# Patient Record
Sex: Female | Born: 1985 | Race: White | Hispanic: No | Marital: Married | Smoking: Never smoker
Health system: Southern US, Community
[De-identification: ages and names within clinical notes are randomized; demographics above are authoritative.]

## PROBLEM LIST (undated history)

## (undated) DIAGNOSIS — Z87448 Personal history of other diseases of urinary system: Secondary | ICD-10-CM

## (undated) DIAGNOSIS — R112 Nausea with vomiting, unspecified: Secondary | ICD-10-CM

## (undated) DIAGNOSIS — I10 Essential (primary) hypertension: Secondary | ICD-10-CM

## (undated) DIAGNOSIS — Z8759 Personal history of other complications of pregnancy, childbirth and the puerperium: Secondary | ICD-10-CM

## (undated) DIAGNOSIS — Z9889 Other specified postprocedural states: Secondary | ICD-10-CM

## (undated) DIAGNOSIS — Z8679 Personal history of other diseases of the circulatory system: Secondary | ICD-10-CM

## (undated) DIAGNOSIS — T8859XA Other complications of anesthesia, initial encounter: Secondary | ICD-10-CM

## (undated) DIAGNOSIS — R42 Dizziness and giddiness: Secondary | ICD-10-CM

## (undated) HISTORY — DX: Essential (primary) hypertension: I10

## (undated) HISTORY — DX: Other specified postprocedural states: Z98.890

## (undated) HISTORY — DX: Personal history of other diseases of urinary system: Z87.448

## (undated) HISTORY — DX: Dizziness and giddiness: R42

## (undated) HISTORY — DX: Personal history of other diseases of the circulatory system: Z86.79

## (undated) HISTORY — DX: Nausea with vomiting, unspecified: R11.2

## (undated) HISTORY — PX: NO PAST SURGERIES: SHX2092

## (undated) HISTORY — DX: Personal history of other complications of pregnancy, childbirth and the puerperium: Z87.59

## (undated) HISTORY — DX: Other complications of anesthesia, initial encounter: T88.59XA

## (undated) HISTORY — PX: DENTAL SURGERY: SHX609

---

## 2012-12-31 ENCOUNTER — Other Ambulatory Visit: Payer: Self-pay | Admitting: *Deleted

## 2012-12-31 ENCOUNTER — Encounter: Payer: Self-pay | Admitting: *Deleted

## 2013-01-01 ENCOUNTER — Telehealth: Payer: Self-pay | Admitting: *Deleted

## 2013-01-01 ENCOUNTER — Ambulatory Visit (INDEPENDENT_AMBULATORY_CARE_PROVIDER_SITE_OTHER): Payer: BC Managed Care – PPO | Admitting: Internal Medicine

## 2013-01-01 ENCOUNTER — Encounter: Payer: Self-pay | Admitting: Internal Medicine

## 2013-01-01 VITALS — BP 131/77 | HR 72 | Ht 64.0 in | Wt 131.1 lb

## 2013-01-01 DIAGNOSIS — I10 Essential (primary) hypertension: Secondary | ICD-10-CM | POA: Insufficient documentation

## 2013-01-01 DIAGNOSIS — R42 Dizziness and giddiness: Secondary | ICD-10-CM | POA: Insufficient documentation

## 2013-01-01 NOTE — Telephone Encounter (Signed)
Lmtcb.  Pt aware Dr. Graciela Husbands has consulted with nephrologists (2) & pt does not need nephrology evaluation at this time. It was recommended ( per Dr. Graciela Husbands) that pt have an ultrasound of her kidneys. Also have a urinalysis yearly with pcp. States he has sent  Message to Dr. Katrinka Blazing regarding this. Mylo Red RN

## 2013-01-01 NOTE — Assessment & Plan Note (Signed)
As above.

## 2013-01-01 NOTE — Progress Notes (Signed)
Foggy feeling.

## 2013-01-01 NOTE — Progress Notes (Signed)
ELECTROPHYSIOLOGY CONSULT NOTE  Patient ID: Melanie Love, MRN: [id number], DOB/AGE: 09/15/1985 27 y.o. Admit date: (Not on file) Date of Consult: 01/01/2013  Primary Physician: Allean Found, MD Primary Cardiologist: new   Chief Complaint: dizziness   HPI Melanie Love is a 27 y.o. 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 Her middle school life was complicated by the development of hematuria and proteinuria after which she was noted to be hypertensive. She's been on antihypertensive therapy with labetalol for a couple of years with good control.  She couple of episodes of nausea and vomiting this spring which were associated and followed by significant dizziness which was orthostatic in nature. She has had some orthostasis with similar epiphenomena associated with rapid standing. She's had no significant heat intolerance, menses aggravation, shower intolerance. She's had no prior syncope.  Her diet is replete of salt and water.  She is recently married.    Past Medical History  Diagnosis Date  . Hypertension   . 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 Dizziness       Surgical History: No past surgical history on file.   Home Meds: Prior to Admission medications   Medication Sig Start Date End Date Taking? Authorizing Provider  Cranberry 250 MG CAPS Take 2 capsules by mouth.    Yes Historical Provider, MD  labetalol (NORMODYNE) 100 MG tablet Take 100 mg by mouth 2 (two) times daily.   Yes Historical Provider, MD  Lactobacillus-Inulin (CULTURELLE DIGESTIVE HEALTH) CAPS Take 1 capsule by mouth.   Yes Historical Provider, MD  levonorgestrel-ethinyl estradiol (AVIANE,ALESSE,LESSINA) 0.1-20 MG-MCG tablet Take 1 tablet by mouth daily.   Yes Historical Provider, MD    Allergies: Not on File  History   Social History  . Marital Status:  Married    Spouse Name: N/A    Number of Children: N/A  . Years of Education: N/A   Occupational History  . Not on file.   Social History Main Topics  . Smoking status: Never Smoker   . Smokeless tobacco: Not on file  . Alcohol Use: Not on file  . Drug Use: Not on file  . Sexually Active: Not on file   Other Topics Concern  . Not on file   Social History Narrative  . No narrative on file     No family history on file.   ROS:  Please see the history of present illness.     All other systems reviewed and negative.    Physical Exam:  BP 126/76  Pulse 83  Ht 5\' 4"  (1.626 m)  Wt 131 lb 1.9 oz (59.476 kg)  BMI 22.5 kg/m2  Alert and oriented in no acute distress HENT- normal Eyes- EOMI, without scleral icterus Skin- warm and dry; without rashes LN-neg Neck- supple without thyromegaly, JVP-flat, carotids brisk and full without bruits Back-without CVAT or kyphosis Lungs-clear to auscultation CV-Regular rate and rhythm, nl S1 and S2, no murmurs gallops or rubs, S4-absent Abd-soft with active bowel sounds; no midline pulsation or hepatomegaly Pulses-intact femoral and distal MKS-without gross deformity Neuro- Ax O, CN3-12 intact, grossly normal motor and sensory function Affect engaging       EKG:   Sinus rhythm at 83 Intervals 05/03/33 Borderline right axis Short PR without evidence of ventricular preexcitation   Assessment and Plan:     Sherryl Manges

## 2013-01-01 NOTE — Assessment & Plan Note (Addendum)
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  We discussed extensively the physiology and the value of theprodrome to give warning to impending symptoms and/or risk of syncope  I dont think her BP issues are related.  I did touch base with nephrology who suggested renal US was valuable, but that further eval based on hematuria 2 years ago is probably not  We revieed the physioogy and the potential interplay for the bulk of an hour

## 2013-10-14 ENCOUNTER — Other Ambulatory Visit (HOSPITAL_COMMUNITY)
Admission: RE | Admit: 2013-10-14 | Discharge: 2013-10-14 | Disposition: A | Discharge status: Home / Self Care | Payer: BC Managed Care – PPO | Source: Ambulatory Visit | Attending: Family Medicine | Admitting: Family Medicine

## 2013-10-14 ENCOUNTER — Other Ambulatory Visit: Payer: Self-pay | Admitting: Family Medicine

## 2013-10-14 DIAGNOSIS — Z01419 Encounter for gynecological examination (general) (routine) without abnormal findings: Secondary | ICD-10-CM | POA: Insufficient documentation

## 2015-03-10 LAB — OB RESULTS CONSOLE GBS: GBS: POSITIVE

## 2015-03-10 LAB — OB RESULTS CONSOLE GC/CHLAMYDIA
Chlamydia: NEGATIVE
Gonorrhea: NEGATIVE

## 2015-03-10 LAB — OB RESULTS CONSOLE RPR: RPR: NONREACTIVE

## 2015-03-10 LAB — OB RESULTS CONSOLE ABO/RH: RH Type: POSITIVE

## 2015-03-10 LAB — OB RESULTS CONSOLE ANTIBODY SCREEN: Antibody Screen: NEGATIVE

## 2015-03-10 LAB — OB RESULTS CONSOLE RUBELLA ANTIBODY, IGM: RUBELLA: IMMUNE

## 2015-03-10 LAB — OB RESULTS CONSOLE HIV ANTIBODY (ROUTINE TESTING): HIV: NONREACTIVE

## 2015-03-10 LAB — OB RESULTS CONSOLE HEPATITIS B SURFACE ANTIGEN: Hepatitis B Surface Ag: NEGATIVE

## 2015-06-27 NOTE — L&D Delivery Note (Signed)
Patient was C/C/+1 and pushed for 2h 40 minutes with epidural.   NSVD female infant, Apgars 8/9, weight pending.   The patient had a right vaginal wall tear and 2nd degree perineal tear both repaired with vicryl. Fundus was firm. EBL was expected amount. Placenta was delivered intact. Vagina was clear.  Baby was vigorous and doing skin to skin with mother.  Philip AspenALLAHAN, Jessicah Croll

## 2015-09-14 ENCOUNTER — Other Ambulatory Visit: Payer: Self-pay | Admitting: Obstetrics and Gynecology

## 2015-09-20 ENCOUNTER — Encounter (HOSPITAL_COMMUNITY): Payer: Self-pay | Admitting: *Deleted

## 2015-09-20 ENCOUNTER — Telehealth (HOSPITAL_COMMUNITY): Payer: Self-pay | Admitting: *Deleted

## 2015-09-20 NOTE — Telephone Encounter (Signed)
Preadmission screen  

## 2015-09-21 ENCOUNTER — Telehealth (HOSPITAL_COMMUNITY): Payer: Self-pay | Admitting: *Deleted

## 2015-09-21 ENCOUNTER — Inpatient Hospital Stay (HOSPITAL_COMMUNITY)
Admission: AD | Admit: 2015-09-21 | Discharge: 2015-09-24 | DRG: 774 | Disposition: A | Discharge status: Home / Self Care | Payer: BC Managed Care – PPO | Source: Ambulatory Visit | Attending: Obstetrics and Gynecology | Admitting: Obstetrics and Gynecology

## 2015-09-21 ENCOUNTER — Encounter (HOSPITAL_COMMUNITY): Payer: Self-pay | Admitting: *Deleted

## 2015-09-21 DIAGNOSIS — Z3A40 40 weeks gestation of pregnancy: Secondary | ICD-10-CM | POA: Diagnosis not present

## 2015-09-21 DIAGNOSIS — O99824 Streptococcus B carrier state complicating childbirth: Secondary | ICD-10-CM | POA: Diagnosis present

## 2015-09-21 DIAGNOSIS — I10 Essential (primary) hypertension: Secondary | ICD-10-CM | POA: Diagnosis present

## 2015-09-21 DIAGNOSIS — O1002 Pre-existing essential hypertension complicating childbirth: Principal | ICD-10-CM | POA: Diagnosis present

## 2015-09-21 DIAGNOSIS — Z8249 Family history of ischemic heart disease and other diseases of the circulatory system: Secondary | ICD-10-CM | POA: Diagnosis not present

## 2015-09-21 DIAGNOSIS — R030 Elevated blood-pressure reading, without diagnosis of hypertension: Secondary | ICD-10-CM | POA: Diagnosis present

## 2015-09-21 DIAGNOSIS — O139 Gestational [pregnancy-induced] hypertension without significant proteinuria, unspecified trimester: Secondary | ICD-10-CM | POA: Diagnosis present

## 2015-09-21 DIAGNOSIS — O133 Gestational [pregnancy-induced] hypertension without significant proteinuria, third trimester: Secondary | ICD-10-CM

## 2015-09-21 LAB — COMPREHENSIVE METABOLIC PANEL
ALBUMIN: 3.3 g/dL — AB (ref 3.5–5.0)
ALT: 35 U/L (ref 14–54)
AST: 38 U/L (ref 15–41)
Alkaline Phosphatase: 146 U/L — ABNORMAL HIGH (ref 38–126)
Anion gap: 8 (ref 5–15)
BUN: 12 mg/dL (ref 6–20)
CHLORIDE: 105 mmol/L (ref 101–111)
CO2: 24 mmol/L (ref 22–32)
Calcium: 9.5 mg/dL (ref 8.9–10.3)
Creatinine, Ser: 0.65 mg/dL (ref 0.44–1.00)
GFR calc Af Amer: >60 mL/min (ref >60)
GFR calc non Af Amer: >60 mL/min (ref >60)
GLUCOSE: 72 mg/dL (ref 65–99)
POTASSIUM: 4.2 mmol/L (ref 3.5–5.1)
SODIUM: 137 mmol/L (ref 135–145)
Total Bilirubin: 0.3 mg/dL (ref 0.3–1.2)
Total Protein: 6.7 g/dL (ref 6.5–8.1)

## 2015-09-21 LAB — CBC
HCT: 37.3 % (ref 36.0–46.0)
HEMOGLOBIN: 13.1 g/dL (ref 12.0–15.0)
MCH: 30.9 pg (ref 26.0–34.0)
MCHC: 35.1 g/dL (ref 30.0–36.0)
MCV: 88 fL (ref 78.0–100.0)
Platelets: 176 10*3/uL (ref 150–400)
RBC: 4.24 MIL/uL (ref 3.87–5.11)
RDW: 13.8 % (ref 11.5–15.5)
WBC: 7.8 10*3/uL (ref 4.0–10.5)

## 2015-09-21 LAB — URIC ACID: Uric Acid, Serum: 5.7 mg/dL (ref 2.3–6.6)

## 2015-09-21 LAB — ABO/RH: ABO/RH(D): O POS

## 2015-09-21 LAB — TYPE AND SCREEN
ABO/RH(D): O POS
ANTIBODY SCREEN: NEGATIVE

## 2015-09-21 MED ORDER — TERBUTALINE SULFATE 1 MG/ML IJ SOLN
0.2500 mg | Freq: Once | INTRAMUSCULAR | Status: DC | PRN
  Filled 2015-09-21: qty 1

## 2015-09-21 MED ORDER — LIDOCAINE HCL (PF) 1 % IJ SOLN
30.0000 mL | INTRAMUSCULAR | Status: DC | PRN
  Administered 2015-09-22: 30 mL via SUBCUTANEOUS
  Filled 2015-09-21: qty 30

## 2015-09-21 MED ORDER — ACETAMINOPHEN 325 MG PO TABS
650.0000 mg | ORAL_TABLET | ORAL | Status: DC | PRN
  Administered 2015-09-21 – 2015-09-22 (×2): 650 mg via ORAL
  Filled 2015-09-21 (×2): qty 2

## 2015-09-21 MED ORDER — OXYCODONE-ACETAMINOPHEN 5-325 MG PO TABS: 2.0000 | ORAL_TABLET | ORAL | Status: DC | PRN

## 2015-09-21 MED ORDER — OXYTOCIN 10 UNIT/ML IJ SOLN: 2.5000 [IU]/h | INTRAVENOUS | Status: DC

## 2015-09-21 MED ORDER — HYDRALAZINE HCL 20 MG/ML IJ SOLN: 10.0000 mg | Freq: Once | INTRAMUSCULAR | Status: DC | PRN

## 2015-09-21 MED ORDER — LACTATED RINGERS IV SOLN
INTRAVENOUS | Status: DC
  Administered 2015-09-22: 03:00:00 via INTRAVENOUS

## 2015-09-21 MED ORDER — FLEET ENEMA 7-19 GM/118ML RE ENEM: 1.0000 | ENEMA | RECTAL | Status: DC | PRN

## 2015-09-21 MED ORDER — OXYTOCIN BOLUS FROM INFUSION: 500.0000 mL | INTRAVENOUS | Status: DC

## 2015-09-21 MED ORDER — ONDANSETRON HCL 4 MG/2ML IJ SOLN
4.0000 mg | Freq: Four times a day (QID) | INTRAMUSCULAR | Status: DC | PRN
  Administered 2015-09-22: 4 mg via INTRAVENOUS
  Filled 2015-09-21: qty 2

## 2015-09-21 MED ORDER — CITRIC ACID-SODIUM CITRATE 334-500 MG/5ML PO SOLN
30.0000 mL | ORAL | Status: DC | PRN
  Administered 2015-09-21: 30 mL via ORAL
  Filled 2015-09-21: qty 15

## 2015-09-21 MED ORDER — PENICILLIN G POTASSIUM 5000000 UNITS IJ SOLR
5.0000 10*6.[IU] | Freq: Once | INTRAMUSCULAR | Status: AC
  Administered 2015-09-21: 5 10*6.[IU] via INTRAVENOUS
  Filled 2015-09-21: qty 5

## 2015-09-21 MED ORDER — MISOPROSTOL 25 MCG QUARTER TABLET
25.0000 ug | ORAL_TABLET | ORAL | Status: DC
  Administered 2015-09-21: 25 ug via VAGINAL
  Filled 2015-09-21 (×3): qty 1
  Filled 2015-09-21: qty 0.25
  Filled 2015-09-21 (×3): qty 1

## 2015-09-21 MED ORDER — LACTATED RINGERS IV SOLN: 500.0000 mL | INTRAVENOUS | Status: DC | PRN

## 2015-09-21 MED ORDER — OXYTOCIN 10 UNIT/ML IJ SOLN
1.0000 m[IU]/min | INTRAVENOUS | Status: DC
  Administered 2015-09-21 – 2015-09-22 (×2): 2 m[IU]/min via INTRAVENOUS
  Filled 2015-09-21: qty 10

## 2015-09-21 MED ORDER — OXYCODONE-ACETAMINOPHEN 5-325 MG PO TABS
1.0000 | ORAL_TABLET | ORAL | Status: DC | PRN
  Administered 2015-09-21: 1 via ORAL

## 2015-09-21 MED ORDER — PENICILLIN G POTASSIUM 5000000 UNITS IJ SOLR
2.5000 10*6.[IU] | INTRAMUSCULAR | Status: DC
  Administered 2015-09-21 – 2015-09-22 (×4): 2.5 10*6.[IU] via INTRAVENOUS
  Filled 2015-09-21 (×8): qty 2.5

## 2015-09-21 MED ORDER — LABETALOL HCL 5 MG/ML IV SOLN
20.0000 mg | INTRAVENOUS | Status: DC | PRN
  Administered 2015-09-22: 20 mg via INTRAVENOUS
  Filled 2015-09-21: qty 4

## 2015-09-21 MED ORDER — OXYCODONE-ACETAMINOPHEN 5-325 MG PO TABS
1.0000 | ORAL_TABLET | Freq: Once | ORAL | Status: AC
  Administered 2015-09-21: 1 via ORAL
  Filled 2015-09-21: qty 1

## 2015-09-21 NOTE — Progress Notes (Signed)
cytotec 25mcg vaginally placed by Dr Tenny Crawoss

## 2015-09-21 NOTE — Telephone Encounter (Signed)
Preadmission screen  

## 2015-09-21 NOTE — H&P (Addendum)
Melanie Love is a 30 y.o. female presenting for IOL for elevated BPS  30 yo G1P0 @ 40+0 who presented for her routine PNV today and was noted to have elevated BPS 140/90. Pt has a h/o chronic hypertension and has been on labetalol  bid with normal blood pressures throughout the pregnancy. Today was the first time she has had elevated BPs.  Pt is scheduled for IOL tomorrow night. Given elevated BPs and that today is patient's due date will proceed with IOL.   History OB History    Gravida Para Term Preterm AB TAB SAB Ectopic Multiple Living   1              Past Medical History  Diagnosis Date  . Hypertension   . 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 Dizziness   . H/O hematuria -Proteinuria    Past Surgical History  Procedure Laterality Date  . No past surgeries     Family History: family history includes Cancer in her father; Hyperlipidemia in her father; Hypertension in her mother. There is no history of Alcohol abuse, Arthritis, Asthma, Birth defects, COPD, Depression, Diabetes, Drug abuse, Early death, Hearing loss, Heart disease, Kidney disease, Learning disabilities, Mental illness, Mental retardation, Miscarriages / Stillbirths, Stroke, Vision loss, or Varicose Veins. Social History:  reports that she has never smoked. She has never used smokeless tobacco. She reports that she does not drink alcohol or use illicit drugs.   Prenatal Transfer Tool  Maternal Diabetes: No Genetic Screening: Normal Maternal Ultrasounds/Referrals: Normal Fetal Ultrasounds or other Referrals:  None Maternal Substance Abuse:  No Significant Maternal Medications:  None Significant Maternal Lab Results:  None Other Comments:  None  ROS: as above  Dilation: 2.5 Effacement (%): 70 Station: -2, -3 Exam by:: herr Blood pressure 204/92, pulse 61, temperature 98.1 F (36.7 C), temperature source Oral, height  (1.626 m), weight 75.297 kg (166 lb), last menstrual period 12/15/2014. Exam Physical  Exam  Prenatal labs: ABO, Rh: --/--/O POS, O POS (03/28 1535) Antibody: NEG (03/28 1535) Rubella: Immune (09/14 0000) RPR: Nonreactive (09/14 0000)  HBsAg: Negative (09/14 0000)  HIV: Non-reactive (09/14 0000)  GBS: Positive (09/14 0000)   Results for orders placed or performed during the hospital encounter of 09/21/15 (from the past 24 hour(s))  Uric acid     Status: None   Collection Time: 09/21/15  3:35 PM  Result Value Ref Range   Uric Acid, Serum 5.7 2.3 - 6.6 mg/dL  Comprehensive metabolic panel     Status: Abnormal   Collection Time: 09/21/15  3:35 PM  Result Value Ref Range   Sodium 137 135 - 145 mmol/L   Potassium 4.2 3.5 - 5.1 mmol/L   Chloride 105 101 - 111 mmol/L   CO2 24 22 - 32 mmol/L   Glucose, Bld 72 65 - 99 mg/dL   BUN 12 6 - 20 mg/dL   Creatinine, Ser 4.09 0.44 - 1.00 mg/dL   Calcium 9.5 8.9 - 81.1 mg/dL   Total Protein 6.7 6.5 - 8.1 g/dL   Albumin 3.3 (L) 3.5 - 5.0 g/dL   AST 38 15 - 41 U/L   ALT 35 14 - 54 U/L   Alkaline Phosphatase 146 (H) 38 - 126 U/L   Total Bilirubin 0.3 0.3 - 1.2 mg/dL   GFR calc non Af Amer >60 >60 mL/min   GFR calc Af Amer >60 >60 mL/min   Anion gap 8 5 - 15  CBC  Status: None   Collection Time: 09/21/15  3:35 PM  Result Value Ref Range   WBC 7.8 4.0 - 10.5 K/uL   RBC 4.24 3.87 - 5.11 MIL/uL   Hemoglobin 13.1 12.0 - 15.0 g/dL   HCT 45.437.3 09.836.0 - 11.946.0 %   MCV 88.0 78.0 - 100.0 fL   MCH 30.9 26.0 - 34.0 pg   MCHC 35.1 30.0 - 36.0 g/dL   RDW 14.713.8 82.911.5 - 56.215.5 %   Platelets 176 150 - 400 K/uL  Type and screen [facility]     Status: None   Collection Time: 09/21/15  3:35 PM  Result Value Ref Range   ABO/RH(D) O POS    Antibody Screen NEG    Sample Expiration 09/24/2015   ABO/Rh     Status: None   Collection Time: 09/21/15  3:35 PM  Result Value Ref Range   ABO/RH(D) O POS     Assessment/Plan: 1) Admit 2) PIH labs 3) Labetalol protocol 4) Plan 1-2 doses of cytotec then pit 5) PCN for  GBS+  Caliber Landess H. 09/21/2015, 6:13 PM

## 2015-09-22 ENCOUNTER — Inpatient Hospital Stay (HOSPITAL_COMMUNITY): Admission: RE | Admit: 2015-09-22 | Payer: BC Managed Care – PPO | Source: Ambulatory Visit

## 2015-09-22 ENCOUNTER — Inpatient Hospital Stay (HOSPITAL_COMMUNITY): Payer: BC Managed Care – PPO | Admitting: Anesthesiology

## 2015-09-22 ENCOUNTER — Encounter (HOSPITAL_COMMUNITY): Payer: Self-pay | Admitting: *Deleted

## 2015-09-22 LAB — CBC WITH DIFFERENTIAL/PLATELET
Basophils Absolute: 0 10*3/uL (ref 0.0–0.1)
Basophils Relative: 0 %
EOS ABS: 0 10*3/uL (ref 0.0–0.7)
Eosinophils Relative: 0 %
HCT: 34.3 % — ABNORMAL LOW (ref 36.0–46.0)
Hemoglobin: 11.9 g/dL — ABNORMAL LOW (ref 12.0–15.0)
LYMPHS ABS: 0.8 10*3/uL (ref 0.7–4.0)
LYMPHS PCT: 6 %
MCH: 30.4 pg (ref 26.0–34.0)
MCHC: 34.7 g/dL (ref 30.0–36.0)
MCV: 87.5 fL (ref 78.0–100.0)
MONO ABS: 0.5 10*3/uL (ref 0.1–1.0)
Monocytes Relative: 4 %
Neutro Abs: 12.1 10*3/uL — ABNORMAL HIGH (ref 1.7–7.7)
Neutrophils Relative %: 90 %
Other: 0 %
PLATELETS: 143 10*3/uL — AB (ref 150–400)
RBC: 3.92 MIL/uL (ref 3.87–5.11)
RDW: 13.8 % (ref 11.5–15.5)
WBC: 13.4 10*3/uL — ABNORMAL HIGH (ref 4.0–10.5)

## 2015-09-22 LAB — CBC
HCT: 34.2 % — ABNORMAL LOW (ref 36.0–46.0)
Hemoglobin: 12.3 g/dL (ref 12.0–15.0)
MCH: 31 pg (ref 26.0–34.0)
MCHC: 36 g/dL (ref 30.0–36.0)
MCV: 86.1 fL (ref 78.0–100.0)
PLATELETS: 150 10*3/uL (ref 150–400)
RBC: 3.97 MIL/uL (ref 3.87–5.11)
RDW: 13.7 % (ref 11.5–15.5)
WBC: 10.5 10*3/uL (ref 4.0–10.5)

## 2015-09-22 LAB — RPR: RPR Ser Ql: NONREACTIVE

## 2015-09-22 MED ORDER — LACTATED RINGERS IV SOLN: 500.0000 mL | Freq: Once | INTRAVENOUS | Status: AC

## 2015-09-22 MED ORDER — ONDANSETRON HCL 4 MG/2ML IJ SOLN: 4.0000 mg | INTRAMUSCULAR | Status: DC | PRN

## 2015-09-22 MED ORDER — PHENYLEPHRINE 40 MCG/ML (10ML) SYRINGE FOR IV PUSH (FOR BLOOD PRESSURE SUPPORT)
80.0000 ug | PREFILLED_SYRINGE | INTRAVENOUS | Status: DC | PRN
  Filled 2015-09-22: qty 20
  Filled 2015-09-22: qty 2

## 2015-09-22 MED ORDER — BENZOCAINE-MENTHOL 20-0.5 % EX AERO
1.0000 "application " | INHALATION_SPRAY | CUTANEOUS | Status: DC | PRN
  Administered 2015-09-22: 1 via TOPICAL
  Filled 2015-09-22: qty 56

## 2015-09-22 MED ORDER — TETANUS-DIPHTH-ACELL PERTUSSIS 5-2.5-18.5 LF-MCG/0.5 IM SUSP: 0.5000 mL | Freq: Once | INTRAMUSCULAR | Status: DC

## 2015-09-22 MED ORDER — FENTANYL 2.5 MCG/ML BUPIVACAINE 1/10 % EPIDURAL INFUSION (WH - ANES)
14.0000 mL/h | INTRAMUSCULAR | Status: DC | PRN
  Administered 2015-09-22 (×3): 14 mL/h via EPIDURAL
  Filled 2015-09-22 (×2): qty 125

## 2015-09-22 MED ORDER — EPHEDRINE 5 MG/ML INJ
10.0000 mg | INTRAVENOUS | Status: DC | PRN
  Filled 2015-09-22: qty 2

## 2015-09-22 MED ORDER — LACTATED RINGERS IV SOLN
500.0000 mL | Freq: Once | INTRAVENOUS | Status: AC
  Administered 2015-09-22: 500 mL via INTRAVENOUS

## 2015-09-22 MED ORDER — ZOLPIDEM TARTRATE 5 MG PO TABS: 5.0000 mg | ORAL_TABLET | Freq: Every evening | ORAL | Status: DC | PRN

## 2015-09-22 MED ORDER — SIMETHICONE 80 MG PO CHEW
80.0000 mg | CHEWABLE_TABLET | ORAL | Status: DC | PRN
Start: 2015-09-22 — End: 2015-09-24

## 2015-09-22 MED ORDER — IBUPROFEN 600 MG PO TABS
600.0000 mg | ORAL_TABLET | Freq: Four times a day (QID) | ORAL | Status: DC
  Administered 2015-09-22 – 2015-09-24 (×7): 600 mg via ORAL
  Filled 2015-09-22 (×7): qty 1

## 2015-09-22 MED ORDER — LIDOCAINE HCL (PF) 1 % IJ SOLN
INTRAMUSCULAR | Status: DC | PRN
  Administered 2015-09-22: 5 mL via EPIDURAL
  Administered 2015-09-22: 2 mL via EPIDURAL
  Administered 2015-09-22: 3 mL via EPIDURAL

## 2015-09-22 MED ORDER — ONDANSETRON HCL 4 MG PO TABS: 4.0000 mg | ORAL_TABLET | ORAL | Status: DC | PRN

## 2015-09-22 MED ORDER — WITCH HAZEL-GLYCERIN EX PADS: 1.0000 "application " | MEDICATED_PAD | CUTANEOUS | Status: DC | PRN

## 2015-09-22 MED ORDER — ACETAMINOPHEN 325 MG PO TABS: 650.0000 mg | ORAL_TABLET | ORAL | Status: DC | PRN

## 2015-09-22 MED ORDER — PRENATAL MULTIVITAMIN CH
1.0000 | ORAL_TABLET | Freq: Every day | ORAL | Status: DC
  Administered 2015-09-23: 1 via ORAL
  Filled 2015-09-22: qty 1

## 2015-09-22 MED ORDER — PHENYLEPHRINE 40 MCG/ML (10ML) SYRINGE FOR IV PUSH (FOR BLOOD PRESSURE SUPPORT)
80.0000 ug | PREFILLED_SYRINGE | INTRAVENOUS | Status: DC | PRN
  Filled 2015-09-22: qty 2

## 2015-09-22 MED ORDER — DIPHENHYDRAMINE HCL 25 MG PO CAPS
25.0000 mg | ORAL_CAPSULE | Freq: Four times a day (QID) | ORAL | Status: DC | PRN
Start: 2015-09-22 — End: 2015-09-24

## 2015-09-22 MED ORDER — DIBUCAINE 1 % RE OINT
1.0000 "application " | TOPICAL_OINTMENT | RECTAL | Status: DC | PRN
  Administered 2015-09-22: 1 via RECTAL
  Filled 2015-09-22: qty 28

## 2015-09-22 MED ORDER — SENNOSIDES-DOCUSATE SODIUM 8.6-50 MG PO TABS
2.0000 | ORAL_TABLET | ORAL | Status: DC
  Administered 2015-09-23 – 2015-09-24 (×2): 2 via ORAL
  Filled 2015-09-22 (×2): qty 2

## 2015-09-22 MED ORDER — LABETALOL HCL 100 MG PO TABS
100.0000 mg | ORAL_TABLET | Freq: Two times a day (BID) | ORAL | Status: DC
  Administered 2015-09-22 – 2015-09-24 (×5): 100 mg via ORAL
  Filled 2015-09-22 (×5): qty 1

## 2015-09-22 MED ORDER — OXYCODONE-ACETAMINOPHEN 5-325 MG PO TABS: 1.0000 | ORAL_TABLET | ORAL | Status: DC | PRN

## 2015-09-22 MED ORDER — LANOLIN HYDROUS EX OINT: TOPICAL_OINTMENT | CUTANEOUS | Status: DC | PRN

## 2015-09-22 MED ORDER — OXYCODONE-ACETAMINOPHEN 5-325 MG PO TABS: 2.0000 | ORAL_TABLET | ORAL | Status: DC | PRN

## 2015-09-22 MED ORDER — DIPHENHYDRAMINE HCL 50 MG/ML IJ SOLN: 12.5000 mg | INTRAMUSCULAR | Status: DC | PRN

## 2015-09-22 NOTE — Anesthesia Preprocedure Evaluation (Signed)
Anesthesia Evaluation  Patient identified by MRN, date of birth, ID band Patient awake    Reviewed: Allergy & Precautions, NPO status , Patient's Chart, lab work & pertinent test results, reviewed documented beta blocker date and time   Airway Mallampati: II  TM Distance: >3 FB Neck ROM: Full    Dental  (+) Teeth Intact, Dental Advisory Given   Pulmonary neg pulmonary ROS,    Pulmonary exam normal breath sounds clear to auscultation       Cardiovascular hypertension, Pt. on medications and Pt. on home beta blockers Normal cardiovascular exam Rhythm:Regular Rate:Normal     Neuro/Psych negative neurological ROS  negative psych ROS   GI/Hepatic negative GI ROS, Neg liver ROS,   Endo/Other  negative endocrine ROS  Renal/GU negative Renal ROS     Musculoskeletal negative musculoskeletal ROS (+)   Abdominal   Peds  Hematology negative hematology ROS (+) Plt 150k   Anesthesia Other Findings Day of surgery medications reviewed with the patient.  Reproductive/Obstetrics (+) Pregnancy                             Anesthesia Physical Anesthesia Plan  ASA: III  Anesthesia Plan: Epidural   Post-op Pain Management:    Induction:   Airway Management Planned:   Additional Equipment:   Intra-op Plan:   Post-operative Plan:   Informed Consent: I have reviewed the patients History and Physical, chart, labs and discussed the procedure including the risks, benefits and alternatives for the proposed anesthesia with the patient or authorized representative who has indicated his/her understanding and acceptance.   Dental advisory given  Plan Discussed with:   Anesthesia Plan Comments: (Patient identified. Risks/Benefits/Options discussed with patient including but not limited to bleeding, infection, nerve damage, paralysis, failed block, incomplete pain control, headache, blood pressure changes,  nausea, vomiting, reactions to medication both or allergic, itching and postpartum back pain. Confirmed with bedside nurse the patient's most recent platelet count. Confirmed with patient that they are not currently taking any anticoagulation, have any bleeding history or any family history of bleeding disorders. Patient expressed understanding and wished to proceed. All questions were answered. )        Anesthesia Quick Evaluation

## 2015-09-22 NOTE — Anesthesia Procedure Notes (Signed)
Epidural Patient location during procedure: OB  Staffing Anesthesiologist: Creedence Kunesh EDWARD Performed by: anesthesiologist   Preanesthetic Checklist Completed: patient identified, pre-op evaluation, timeout performed, IV checked, risks and benefits discussed and monitors and equipment checked  Epidural Patient position: sitting Prep: DuraPrep Patient monitoring: blood pressure and continuous pulse ox Approach: midline Location: L3-L4 Injection technique: LOR air  Needle:  Needle type: Tuohy  Needle gauge: 17 G Needle length: 9 cm Needle insertion depth: 5 cm Catheter size: 19 Gauge Catheter at skin depth: 10 cm Test dose: negative and Other (1% Lidocaine)  Additional Notes Patient identified.  Risk benefits discussed including failed block, incomplete pain control, headache, nerve damage, paralysis, blood pressure changes, nausea, vomiting, reactions to medication both toxic or allergic, and postpartum back pain.  Patient expressed understanding and wished to proceed.  All questions were answered.  Sterile technique used throughout procedure and epidural site dressed with sterile barrier dressing. No paresthesia or other complications noted. The patient did not experience any signs of intravascular injection such as tinnitus or metallic taste in mouth nor signs of intrathecal spread such as rapid motor block. Please see nursing notes for vital signs. Reason for block:procedure for pain   

## 2015-09-22 NOTE — Lactation Note (Signed)
This note was copied from a baby's chart. Lactation Consultation Note  Patient Name: Melanie Jacqulyn LinerKatlin Manetta QQPYP'PToday's Date: 09/22/2015 Reason for consult: Initial assessment Baby at 6 hr of life and mom is reporting trouble with latch. Mom has inverted nipples. She was using a #16 NS upon entry and stated that it pinched. Applied #20 NS and she stated that it felt a little better but was still a little pinching. R nipple was everted after baby was moved to the L nipple and remained out for the rest of the lactation consult. Demonstrated to parents how to support baby's head/neck while pulling him in close to the breast. Mom is nervious because she does not want to "break the baby". FOB is involved and very helpful with positioning. Colostrum noted in NS after latching. Mom was able to use the Harmony to get several drops of colostrum. She can manually express and there is a spoon in the room. Discussed baby behavior, feeding frequency, baby belly size, voids, wt loss, breast changes, and nipple care. Encouraged mom to d/c NS ad soon as she can and post pump 5-10 minutes after using the NS. Given lactation handouts. Aware of OP services and support group.       Maternal Data Has patient been taught Hand Expression?: Yes Does the patient have breastfeeding experience prior to this delivery?: No  Feeding Feeding Type: Breast Fed Length of feed: 15 min  LATCH Score/Interventions Latch: Repeated attempts needed to sustain latch, nipple held in mouth throughout feeding, stimulation needed to elicit sucking reflex. Intervention(s): Adjust position;Assist with latch;Breast compression  Audible Swallowing: Spontaneous and intermittent  Type of Nipple: Inverted Intervention(s): Shells;Hand pump  Comfort (Breast/Nipple): Filling, red/small blisters or bruises, mild/mod discomfort  Problem noted: Mild/Moderate discomfort Interventions (Mild/moderate discomfort): Hand expression  Hold (Positioning):  Assistance needed to correctly position infant at breast and maintain latch. Intervention(s): Support Pillows;Position options  LATCH Score: 5  Lactation Tools Discussed/Used Tools: Nipple Shields Nipple shield size: 20 WIC Program: No   Consult Status Consult Status: Follow-up Date: 09/23/15 Follow-up type: In-patient    Rulon Eisenmengerlizabeth E Moshe Wenger 09/22/2015, 6:40 PM

## 2015-09-22 NOTE — Anesthesia Postprocedure Evaluation (Signed)
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  Procedure(s) Performed: * No procedures listed *  Patient location during evaluation: Mother Baby Anesthesia Type: Epidural Level of consciousness: awake and alert and oriented Pain management: satisfactory to patient Vital Signs Assessment: post-procedure vital signs reviewed and stable Respiratory status: spontaneous breathing and nonlabored ventilation Cardiovascular status: stable Postop Assessment: no headache, no backache, no signs of nausea or vomiting, adequate PO intake and patient able to bend at knees (patient up walking) Anesthetic complications: no    Last Vitals:  Filed Vitals:   09/22/15 1503 09/22/15 1822  BP: 149/93 126/85  Pulse: 72 88  Temp:  36.8 C  Resp:  20    Last Pain:  Filed Vitals:   09/22/15 1851  PainSc: 2                  Adrieanna Boteler

## 2015-09-23 LAB — CBC
HCT: 31.4 % — ABNORMAL LOW (ref 36.0–46.0)
HEMOGLOBIN: 11 g/dL — AB (ref 12.0–15.0)
MCH: 30.7 pg (ref 26.0–34.0)
MCHC: 35 g/dL (ref 30.0–36.0)
MCV: 87.7 fL (ref 78.0–100.0)
PLATELETS: 143 10*3/uL — AB (ref 150–400)
RBC: 3.58 MIL/uL — AB (ref 3.87–5.11)
RDW: 13.8 % (ref 11.5–15.5)
WBC: 11.7 10*3/uL — AB (ref 4.0–10.5)

## 2015-09-23 NOTE — Progress Notes (Signed)
PPD#1 Pt is doing well.  VSSAF IMP/ stable PLAN/ routine care

## 2015-09-23 NOTE — Lactation Note (Signed)
This note was copied from a baby's chart. Lactation Consultation Note New mom having issues latching baby d/t pain. Staff reports mom has inverted Lt. Nipple and flat Rt. Nipple. Mom states her Lt. Nipple is flat prior to start of BF. At this time, both nipples are small, short shaft everted nipples. Both nipples are bleeding after BF started. Mom wearing #20 NS, appears slightly large, applied #16 NS. Baby has a strong tight suck. Hard to flange lips. Had to loosen latch and flare lips. Mom stated it felt much better that she couldn't feel pain when baby was suckling. Heard swallows, but when baby came off nipple, bloody drainage in NS. Breast is noticeably softer after BF. Prior to BF, on breast massage noted small knots, w/massage noted to be gone. Encouraged mom to feel breast before and after BF. Encouraged mom to wear shells between BF. Encouraged mo to wear comfort gels. Encouraged mom to report to her MD of Nipples bleeding.  Patient Name: Melanie Love Reason for consult: Follow-up assessment;Breast/nipple pain   Maternal Data    Feeding Feeding Type: Breast Fed Length of feed: 20 min  LATCH Score/Interventions Latch: Repeated attempts needed to sustain latch, nipple held in mouth throughout feeding, stimulation needed to elicit sucking reflex. Intervention(s): Adjust position;Assist with latch;Breast massage;Breast compression  Audible Swallowing: A few with stimulation Intervention(s): Hand expression;Alternate breast massage  Type of Nipple: Everted at rest and after stimulation (nipples are everted at this time.) Intervention(s): Shells;Hand pump  Comfort (Breast/Nipple): Engorged, cracked, bleeding, large blisters, severe discomfort Problem noted: Cracked, bleeding, blisters, bruises Intervention(s): Expressed breast milk to nipple  Problem noted: Severe discomfort;Cracked, bleeding, blisters, bruises Interventions   (Cracked/bleeding/bruising/blister): Expressed breast milk to nipple Interventions (Mild/moderate discomfort): Comfort gels;Breast shields;Hand massage;Hand expression  Hold (Positioning): Assistance needed to correctly position infant at breast and maintain latch. Intervention(s): Support Pillows;Position options;Breastfeeding basics reviewed;Skin to skin  LATCH Score: 5  Lactation Tools Discussed/Used Tools: Shells;Nipple Shields;Pump;Comfort gels Nipple shield size: 16 Shell Type: Inverted Breast pump type: Manual   Consult Status Consult Status: Follow-up Date: 09/23/15 Follow-up type: In-patient    Charyl DancerCARVER, Amaru Burroughs G Love, 6:36 AM

## 2015-09-24 ENCOUNTER — Inpatient Hospital Stay (HOSPITAL_COMMUNITY): Admission: RE | Admit: 2015-09-24 | Payer: BC Managed Care – PPO | Source: Ambulatory Visit

## 2015-09-24 NOTE — Lactation Note (Signed)
This note was copied from a baby's chart. Lactation Consultation Note   Baby has not voided in 20 hours.  Mom has cracked bleeding nipples and is using a #16 NS that seems small but is more comfortable for her.   Assisted with positioning and help mother with breast compression so that Elijah would transfer.  He needed a great deal of encouragement to stay active at the breast but mom's breasts finally softened. Taught her how to identify swallows.  It was noted that baby is very tight in the posterior portion of his mouth; jaw massage seemed to help a bit. With tongue extension there is notable retraction in the center. This variation is suggestive of oral restriction.  Mother encouraged to work on positioning,post-pump if breasts don't soften with feedings and to follow-up as an outpatient. About 15 minutes after the feeding Elijah voided a moderate amount of concentrated urine. Patient Name: Melanie Love UJWJX'BToday's Date: 09/24/2015 Reason for consult: Follow-up assessment;Breast/nipple pain;Other (Comment) (no void in 24 hours)   Maternal Data    Feeding Feeding Type: Breast Fed Length of feed: 30 min  LATCH Score/Interventions Latch: Repeated attempts needed to sustain latch, nipple held in mouth throughout feeding, stimulation needed to elicit sucking reflex. Intervention(s): Adjust position;Breast compression  Audible Swallowing: A few with stimulation Intervention(s): Skin to skin;Hand expression  Type of Nipple: Flat (short shaft) Intervention(s): Hand pump;Shells;Double electric pump  Comfort (Breast/Nipple): Engorged, cracked, bleeding, large blisters, severe discomfort Problem noted: Cracked, bleeding, blisters, bruises Intervention(s): Expressed breast milk to nipple  Problem noted: Severe discomfort Interventions  (Cracked/bleeding/bruising/blister): Expressed breast milk to nipple;Double electric pump Interventions (Mild/moderate discomfort): Hand expression;Comfort  gels Interventions (Severe discomfort): Double electric pum  Hold (Positioning): Assistance needed to correctly position infant at breast and maintain latch. Intervention(s): Breastfeeding basics reviewed;Support Pillows;Position options;Skin to skin  LATCH Score: 4  Lactation Tools Discussed/Used Tools: Nipple Shields;Comfort gels Nipple shield size: 16   Consult Status Consult Status: Follow-up Follow-up type: Other (comment) (advised outpatient follow-up)    Soyla DryerJoseph, Ryan Ogborn 09/24/2015, 11:37 AM

## 2015-09-24 NOTE — Progress Notes (Signed)
Patient is eating, ambulating, voiding.  Pain control is good.  Filed Vitals:   09/23/15 1003 09/23/15 1804 09/23/15 2208 09/24/15 0604  BP: 139/79 147/90 133/80 150/90  Pulse: 91 80 71 70  Temp: 97.7 F (36.5 C) 98.7 F (37.1 C)  98.3 F (36.8 C)  TempSrc:  Oral    Resp:  19    Height:      Weight:        Fundus firm Perineum without swelling.  Lab Results  Component Value Date   WBC 11.7* 09/23/2015   HGB 11.0* 09/23/2015   HCT 31.4* 09/23/2015   MCV 87.7 09/23/2015   PLT 143* 09/23/2015    --/--/O POS, O POS (03/28 1535)/RI  A/P Post partum day 2.  Routine care.  Expect d/c today.    Isay Perleberg A

## 2015-09-24 NOTE — Discharge Summary (Signed)
Obstetric Discharge Summary Reason for Admission: induction of labor Prenatal Procedures: superimposed PIH Intrapartum Procedures: spontaneous vaginal delivery Postpartum Procedures: none Complications-Operative and Postpartum: second degree perineal laceration and r vaginal side wall tear HEMOGLOBIN  Date Value Ref Range Status  09/23/2015 11.0* 12.0 - 15.0 g/dL Final   HCT  Date Value Ref Range Status  09/23/2015 31.4* 36.0 - 46.0 % Final     Discharge Diagnoses: Term Pregnancy-delivered  Discharge Information: Date: 09/24/2015 Activity: pelvic rest Diet: routine Medications: Ibuprofen Condition: stable Instructions: refer to practice specific booklet Discharge to: home Follow-up Information    Follow up with [name], MD In 2 weeks.   Specialty:  Obstetrics and Gynecology   Why:  bp check   Contact information:   [address] [place] [address] [phone]       Newborn Data: Live born female  Birth Weight: 7 lb (3175 g) APGAR: 8, 9  Home with mother.  Melanie Love A 09/24/2015, 7:24 AM

## 2015-09-27 ENCOUNTER — Encounter (HOSPITAL_COMMUNITY): Payer: Self-pay | Admitting: *Deleted

## 2015-10-06 ENCOUNTER — Inpatient Hospital Stay (HOSPITAL_COMMUNITY)
Admission: AD | Admit: 2015-10-06 | Discharge: 2015-10-06 | Disposition: A | Discharge status: Home / Self Care | Payer: BC Managed Care – PPO | Source: Ambulatory Visit | Attending: Obstetrics and Gynecology | Admitting: Obstetrics and Gynecology

## 2015-10-06 ENCOUNTER — Encounter (HOSPITAL_COMMUNITY): Payer: Self-pay

## 2015-10-06 DIAGNOSIS — O135 Gestational [pregnancy-induced] hypertension without significant proteinuria, complicating the puerperium: Secondary | ICD-10-CM | POA: Diagnosis not present

## 2015-10-06 DIAGNOSIS — O139 Gestational [pregnancy-induced] hypertension without significant proteinuria, unspecified trimester: Secondary | ICD-10-CM

## 2015-10-06 LAB — COMPREHENSIVE METABOLIC PANEL
ALT: 48 U/L (ref 14–54)
AST: 42 U/L — ABNORMAL HIGH (ref 15–41)
Albumin: 3.9 g/dL (ref 3.5–5.0)
Alkaline Phosphatase: 101 U/L (ref 38–126)
Anion gap: 8 (ref 5–15)
BUN: 16 mg/dL (ref 6–20)
CO2: 26 mmol/L (ref 22–32)
Calcium: 10.8 mg/dL — ABNORMAL HIGH (ref 8.9–10.3)
Chloride: 105 mmol/L (ref 101–111)
Creatinine, Ser: 0.73 mg/dL (ref 0.44–1.00)
GFR calc Af Amer: >60 mL/min (ref >60)
GFR calc non Af Amer: >60 mL/min (ref >60)
Glucose, Bld: 95 mg/dL (ref 65–99)
Potassium: 4 mmol/L (ref 3.5–5.1)
Sodium: 139 mmol/L (ref 135–145)
Total Bilirubin: 0.3 mg/dL (ref 0.3–1.2)
Total Protein: 6.9 g/dL (ref 6.5–8.1)

## 2015-10-06 LAB — CBC
HCT: 38.2 % (ref 36.0–46.0)
Hemoglobin: 13.4 g/dL (ref 12.0–15.0)
MCH: 30.6 pg (ref 26.0–34.0)
MCHC: 35.1 g/dL (ref 30.0–36.0)
MCV: 87.2 fL (ref 78.0–100.0)
Platelets: 265 10*3/uL (ref 150–400)
RBC: 4.38 MIL/uL (ref 3.87–5.11)
RDW: 13.1 % (ref 11.5–15.5)
WBC: 6.1 10*3/uL (ref 4.0–10.5)

## 2015-10-06 LAB — PROTEIN / CREATININE RATIO, URINE
Creatinine, Urine: 24 mg/dL
Total Protein, Urine: <6 mg/dL

## 2015-10-06 MED ORDER — LABETALOL HCL 200 MG PO TABS: 200.0000 mg | ORAL_TABLET | Freq: Three times a day (TID) | ORAL | Status: DC

## 2015-10-06 MED ORDER — ACETAMINOPHEN 500 MG PO TABS
1000.0000 mg | ORAL_TABLET | Freq: Four times a day (QID) | ORAL | Status: DC | PRN
  Administered 2015-10-06: 1000 mg via ORAL
  Filled 2015-10-06: qty 2

## 2015-10-06 NOTE — MAU Note (Signed)
Patient sent from office for BP eval.

## 2015-10-06 NOTE — Discharge Instructions (Signed)
Preeclampsia and Eclampsia °Preeclampsia is a serious condition that develops only during pregnancy. It is also called toxemia of pregnancy. This condition causes high blood pressure along with other symptoms, such as swelling and headaches. These may develop as the condition gets worse. Preeclampsia may occur 20 weeks or later into your pregnancy.  °Diagnosing and treating preeclampsia early is very important. If not treated early, it can cause serious problems for you and your baby. One problem it can lead to is eclampsia, which is a condition that causes muscle jerking or shaking (convulsions) in the mother. Delivering your baby is the best treatment for preeclampsia or eclampsia.  °RISK FACTORS °The cause of preeclampsia is not known. You may be more likely to develop preeclampsia if you have certain risk factors. These include:  °· Being pregnant for the first time. °· Having preeclampsia in a past pregnancy. °· Having a family history of preeclampsia. °· Having high blood pressure. °· Being pregnant with twins or triplets. °· Being 35 or older. °· Being African American. °· Having kidney disease or diabetes. °· Having medical conditions such as lupus or blood diseases. °· Being very overweight (obese). °SIGNS AND SYMPTOMS  °The earliest signs of preeclampsia are: °· High blood pressure. °· Increased protein in your urine. Your health care provider will check for this at every prenatal visit. °Other symptoms that can develop include:  °· Severe headaches. °· Sudden weight gain. °· Swelling of your hands, face, legs, and feet. °· Feeling sick to your stomach (nauseous) and throwing up (vomiting). °· Vision problems (blurred or double vision). °· Numbness in your face, arms, legs, and feet. °· Dizziness. °· Slurred speech. °· Sensitivity to bright lights. °· Abdominal pain. °DIAGNOSIS  °There are no screening tests for preeclampsia. Your health care provider will ask you about symptoms and check for signs of  preeclampsia during your prenatal visits. You may also have tests, including: °· Urine testing. °· Blood testing. °· Checking your baby's heart rate. °· Checking the health of your baby and your placenta using images created with sound waves (ultrasound). °TREATMENT  °You can work out the best treatment approach together with your health care provider. It is very important to keep all prenatal appointments. If you have an increased risk of preeclampsia, you may need more frequent prenatal exams. °· Your health care provider may prescribe bed rest. °· You may have to eat as little salt as possible. °· You may need to take medicine to lower your blood pressure if the condition does not respond to more conservative measures. °· You may need to stay in the hospital if your condition is severe. There, treatment will focus on controlling your blood pressure and fluid retention. You may also need to take medicine to prevent seizures. °· If the condition gets worse, your baby may need to be delivered early to protect you and the baby. You may have your labor started with medicine (be induced), or you may have a cesarean delivery. °· Preeclampsia usually goes away after the baby is born. °HOME CARE INSTRUCTIONS  °· Only take over-the-counter or prescription medicines as directed by your health care provider. °· Lie on your left side while resting. This keeps pressure off your baby. °· Elevate your feet while resting. °· Get regular exercise. Ask your health care provider what type of exercise is safe for you. °· Avoid caffeine and alcohol. °· Do not smoke. °· Drink 6-8 glasses of water every day. °· Eat a balanced diet   that is low in salt. Do not add salt to your food.  Avoid stressful situations as much as possible.  Get plenty of rest and sleep.  Keep all prenatal appointments and tests as scheduled. SEEK MEDICAL CARE IF:  You are gaining more weight than expected.  You have any headaches, abdominal pain, or  nausea.  You are bruising more than usual.  You feel dizzy or light-headed. SEEK IMMEDIATE MEDICAL CARE IF:   You develop sudden or severe swelling anywhere in your body. This usually happens in the legs.  You gain 5 lb (2.3 kg) or more in a week.  You have a severe headache, dizziness, problems with your vision, or confusion.  You have severe abdominal pain.  You have lasting nausea or vomiting.  You have a seizure.  You have trouble moving any part of your body.  You develop numbness in your body.  You have trouble speaking.  You have any abnormal bleeding.  You develop a stiff neck.  You pass out. MAKE SURE YOU:   Understand these instructions.  Will watch your condition.  Will get help right away if you are not doing well or get worse.   This information is not intended to replace advice given to you by your health care provider. Make sure you discuss any questions you have with your health care provider.   Document Released: 06/09/2000 Document Revised: 06/17/2013 Document Reviewed: 04/04/2013 Elsevier Interactive Patient Education 2016 Elsevier Inc.  Postpartum Hypertension Postpartum hypertension is high blood pressure after pregnancy that remains higher than normal for more than two days after delivery. You may not realize that you have postpartum hypertension if your blood pressure is not being checked regularly. In some cases, postpartum hypertension will go away on its own, usually within a week of delivery. However, for some women, medical treatment is required to prevent serious complications, such as seizures or stroke. The following things can affect your blood pressure:  The type of delivery you had.  Having received IV fluids or other medicines during or after delivery. CAUSES  Postpartum hypertension may be caused by any of the following or by a combination of any of the following:  Hypertension that existed before pregnancy (chronic  hypertension).  Gestational hypertension.  Preeclampsia or eclampsia.  Receiving a lot of fluid through an IV during or after delivery.  Medicines.  HELLP syndrome.  Hyperthyroidism.  Stroke.  Other rare neurological or blood disorders. In some cases, the cause may not be known. RISK FACTORS Postpartum hypertension can be related to one or more risk factors, such as:  Chronic hypertension. In some cases, this may not have been diagnosed before pregnancy.  Obesity.  Type 2 diabetes.  Kidney disease.  Family history of preeclampsia.  Other medical conditions that cause hormonal imbalances. SIGNS AND SYMPTOMS As with all types of hypertension, postpartum hypertension may not have any symptoms. Depending on how high your blood pressure is, you may experience:  Headaches. These may be mild, moderate, or severe. They may also be steady, constant, or sudden in onset (thunderclap headache).  Visual changes.  Dizziness.  Shortness of breath.  Swelling of your hands, feet, lower legs, or face. In some cases, you may have swelling in more than one of these locations.  Heart palpitations or a racing heartbeat.  Difficulty breathing while lying down.  Decreased urination. Other rare signs and symptoms may include:  Sweating more than usual. This lasts longer than a few days after delivery.  Chest  pain.  Sudden dizziness when you get up from sitting or lying down.  Seizures.  Nausea or vomiting.  Abdominal pain. DIAGNOSIS The diagnosis of postpartum hypertension is made through a combination of physical examination findings and testing of your blood and urine. You may also have additional tests, such as a CT scan or an MRI, to check for other complications of postpartum hypertension. TREATMENT When blood pressure is high enough to require treatment, your options may include:  Medicines to reduce blood pressure (antihypertensives). Tell your health care provider  if you are breastfeeding or if you plan to breastfeed. There are many antihypertensive medicines that are safe to take while breastfeeding.  Stopping medicines that may be causing hypertension.  Treating medical conditions that are causing hypertension.  Treating the complications of hypertension, such as seizures, stroke, or kidney problems. Your health care provider will also continue to monitor your blood pressure closely and repeatedly until it is within a safe range for you.  HOME CARE INSTRUCTIONS  Take medicines only as directed by your health care provider.  Get regular exercise after your health care provider tells you that it is safe.  Follow your health care provider's recommendations on fluid and salt restrictions.  Do not use any tobacco products, including cigarettes, chewing tobacco, or electronic cigarettes. If you need help quitting, ask your health care provider.  Keep all follow-up visits as directed by your health care provider. This is important. SEEK MEDICAL CARE IF:  Your symptoms get worse.  You have new symptoms, such as:  Headache.  Dizziness.  Visual changes. SEEK IMMEDIATE MEDICAL CARE IF:  You develop a severe or sudden headache.  You have seizures.  You develop numbness or weakness on one side of your body.  You have difficulty thinking, speaking, or swallowing.  You develop severe abdominal pain.  You develop difficulty breathing, chest pain, a racing heartbeat, or heart palpitations. These symptoms may represent a serious problem that is an emergency. Do not wait to see if the symptoms will go away. Get medical help right away. Call your local emergency services (911 in the [place] Do not drive yourself to the hospital.   This information is not intended to replace advice given to you by your health care provider. Make sure you discuss any questions you have with your health care provider.   Document Released: 02/13/2014 Document Reviewed:  02/13/2014 Elsevier Interactive Patient Education [employer]2016 Elsevier Inc.

## 2015-10-06 NOTE — MAU Provider Note (Signed)
History     CSN: [id number]  Arrival date and time: 10/06/15 1737   First Provider Initiated Contact with Patient 10/06/15 1809      Chief Complaint  Patient presents with  . Hypertension   HPI  Melanie Love 30 y.o. G1P1001 postpartum presents from the office for preeclampsia work up.  Past Medical History  Diagnosis Date  . Hypertension   . 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 Dizziness   . H/O hematuria -Proteinuria     Past Surgical History  Procedure Laterality Date  . No past surgeries      Family History  Problem Relation Age of Onset  . Hypertension Mother   . Cancer Father   . Hyperlipidemia Father   . Alcohol abuse Neg Hx   . Arthritis Neg Hx   . Asthma Neg Hx   . Birth defects Neg Hx   . COPD Neg Hx   . Depression Neg Hx   . Diabetes Neg Hx   . Drug abuse Neg Hx   . Early death Neg Hx   . Hearing loss Neg Hx   . Heart disease Neg Hx   . Kidney disease Neg Hx   . Learning disabilities Neg Hx   . Mental illness Neg Hx   . Mental retardation Neg Hx   . Miscarriages / Stillbirths Neg Hx   . Stroke Neg Hx   . Vision loss Neg Hx   . Varicose Veins Neg Hx     Social History  Substance Use Topics  . Smoking status: Never Smoker   . Smokeless tobacco: Never Used  . Alcohol Use: No    Allergies: No Known Allergies  Prescriptions prior to admission  Medication Sig Dispense Refill Last Dose  . calcium carbonate (TUMS - DOSED IN MG ELEMENTAL CALCIUM) 500 MG chewable tablet Chew 2 tablets by mouth 2 (two) times daily as needed for indigestion or heartburn.   Past Month at Unknown time  . labetalol (NORMODYNE) 100 MG tablet Take 100 mg by mouth 2 (two) times daily.   10/06/2015 at 0800  . Prenatal Vit-Fe Fumarate-FA (PRENATAL MULTIVITAMIN) TABS tablet Take 1 tablet by mouth at bedtime.   10/05/2015 at Unknown time   Results for orders placed or performed during the hospital encounter of 10/06/15 (from the past 24 hour(s))  Protein / creatinine ratio,  urine     Status: None   Collection Time: 10/06/15  5:41 PM  Result Value Ref Range   Creatinine, Urine 24.00 mg/dL   Total Protein, Urine <6 mg/dL   Protein Creatinine Ratio        0.00 - 0.15 mg/mg[Cre]  CBC     Status: None   Collection Time: 10/06/15  6:11 PM  Result Value Ref Range   WBC 6.1 4.0 - 10.5 K/uL   RBC 4.38 3.87 - 5.11 MIL/uL   Hemoglobin 13.4 12.0 - 15.0 g/dL   HCT 29.538.2 28.436.0 - 13.246.0 %   MCV 87.2 78.0 - 100.0 fL   MCH 30.6 26.0 - 34.0 pg   MCHC 35.1 30.0 - 36.0 g/dL   RDW 44.013.1 10.211.5 - 72.515.5 %   Platelets 265 150 - 400 K/uL  Comprehensive metabolic panel     Status: Abnormal   Collection Time: 10/06/15  6:11 PM  Result Value Ref Range   Sodium 139 135 - 145 mmol/L   Potassium 4.0 3.5 - 5.1 mmol/L   Chloride 105 101 - 111 mmol/L   CO2 26 22 -  32 mmol/L   Glucose, Bld 95 65 - 99 mg/dL   BUN 16 6 - 20 mg/dL   Creatinine, Ser 4.09 0.44 - 1.00 mg/dL   Calcium 81.1 (H) 8.9 - 10.3 mg/dL   Total Protein 6.9 6.5 - 8.1 g/dL   Albumin 3.9 3.5 - 5.0 g/dL   AST 42 (H) 15 - 41 U/L   ALT 48 14 - 54 U/L   Alkaline Phosphatase 101 38 - 126 U/L   Total Bilirubin 0.3 0.3 - 1.2 mg/dL   GFR calc non Af Amer >60 >60 mL/min   GFR calc Af Amer >60 >60 mL/min   Anion gap 8 5 - 15  Review of Systems - Negative except headache ROS Physical Exam  Physical Examination: General appearance - alert, well appearing, and in no distress and oriented to person, place, and time Chest - clear to auscultation, no wheezes, rales or rhonchi, symmetric air entry Heart - normal rate, regular rhythm, normal S1, S2, no murmurs, rubs, clicks or gallops Neurological - alert, oriented, normal speech, no focal findings or movement disorder noted, DTR's normal and symmetric Musculoskeletal - no joint tenderness, deformity or swelling, full range of motion without pain Blood pressure 141/94, pulse 71, temperature 97.7 F (36.5 C), temperature source Oral, resp. rate 17, last menstrual period 12/15/2014,  unknown if currently breastfeeding.  Physical Exam 10/06/15 1900  --  71  --  --  141/94 mmHg  --  --  --  -- AG     10/06/15 1851  --  74  --  --  150/95 mmHg  --  --  --  -- AG    10/06/15 1800  --  83  --  --  152/99 mmHg  --  --  --  -- AG    10/06/15 1749  97.7 F (36.5 C)  88  --  17  158/99 mmHg  High-fowlers  --  --  -- AG          MAU Course  Procedures     Assessment and Plan  Postpartum Hypertension Increase labetalol to  TID Follow up in office Thursday or Friday Discharge  Melanie Love 10/06/2015, 7:24 PM

## 2016-02-09 ENCOUNTER — Emergency Department (HOSPITAL_COMMUNITY)
Admission: EM | Admit: 2016-02-09 | Discharge: 2016-02-09 | Disposition: A | Discharge status: Home / Self Care | Payer: No Typology Code available for payment source | Attending: Emergency Medicine | Admitting: Emergency Medicine

## 2016-02-09 ENCOUNTER — Encounter (HOSPITAL_COMMUNITY): Payer: Self-pay | Admitting: *Deleted

## 2016-02-09 DIAGNOSIS — M542 Cervicalgia: Secondary | ICD-10-CM | POA: Diagnosis not present

## 2016-02-09 DIAGNOSIS — Y999 Unspecified external cause status: Secondary | ICD-10-CM | POA: Diagnosis not present

## 2016-02-09 DIAGNOSIS — Y9389 Activity, other specified: Secondary | ICD-10-CM | POA: Diagnosis not present

## 2016-02-09 DIAGNOSIS — Y92410 Unspecified street and highway as the place of occurrence of the external cause: Secondary | ICD-10-CM | POA: Insufficient documentation

## 2016-02-09 DIAGNOSIS — R51 Headache: Secondary | ICD-10-CM | POA: Diagnosis not present

## 2016-02-09 DIAGNOSIS — I10 Essential (primary) hypertension: Secondary | ICD-10-CM | POA: Diagnosis not present

## 2016-02-09 DIAGNOSIS — Z79899 Other long term (current) drug therapy: Secondary | ICD-10-CM | POA: Diagnosis not present
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
## 2016-02-09 NOTE — ED Notes (Signed)
Patient complains of minimal soreness on left upper side ( head, neck, shoulders). Patient is alert, oriented and able to communicated with staff.

## 2016-02-09 NOTE — Discharge Instructions (Signed)
Ibuprofen for pain as needed.  Rest, Ice on the sore spots
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
## 2016-07-20 ENCOUNTER — Telehealth: Payer: Self-pay

## 2016-07-20 NOTE — Telephone Encounter (Signed)
[facility]. Placed in your folder for review. Trixie Rude/RLB

## 2016-07-27 ENCOUNTER — Encounter: Payer: Self-pay | Admitting: Family Medicine

## 2016-07-27 ENCOUNTER — Ambulatory Visit (INDEPENDENT_AMBULATORY_CARE_PROVIDER_SITE_OTHER): Payer: PRIVATE HEALTH INSURANCE | Admitting: Family Medicine

## 2016-07-27 VITALS — BP 120/80 | HR 87 | Ht 64.5 in | Wt 130.2 lb

## 2016-07-27 DIAGNOSIS — Z0000 Encounter for general adult medical examination without abnormal findings: Secondary | ICD-10-CM | POA: Diagnosis not present

## 2016-07-27 DIAGNOSIS — Z8349 Family history of other endocrine, nutritional and metabolic diseases: Secondary | ICD-10-CM | POA: Diagnosis not present

## 2016-07-27 DIAGNOSIS — I10 Essential (primary) hypertension: Secondary | ICD-10-CM | POA: Diagnosis not present

## 2016-07-27 LAB — POCT URINALYSIS DIPSTICK
BILIRUBIN UA: NEGATIVE
Glucose, UA: NEGATIVE
KETONES UA: NEGATIVE
Leukocytes, UA: NEGATIVE
Nitrite, UA: NEGATIVE
PH UA: 6
Protein, UA: NEGATIVE
RBC UA: NEGATIVE
Spec Grav, UA: 1.01
Urobilinogen, UA: NEGATIVE

## 2016-07-27 LAB — TSH: TSH: 0.85 m[IU]/L

## 2016-07-27 LAB — COMPREHENSIVE METABOLIC PANEL
ALK PHOS: 77 U/L (ref 33–115)
ALT: 13 U/L (ref 6–29)
AST: 15 U/L (ref 10–30)
Albumin: 4.5 g/dL (ref 3.6–5.1)
BUN: 16 mg/dL (ref 7–25)
CALCIUM: 9.4 mg/dL (ref 8.6–10.2)
CHLORIDE: 104 mmol/L (ref 98–110)
CO2: 24 mmol/L (ref 20–31)
Creat: 0.87 mg/dL (ref 0.50–1.10)
Glucose, Bld: 89 mg/dL (ref 65–99)
POTASSIUM: 3.9 mmol/L (ref 3.5–5.3)
Sodium: 138 mmol/L (ref 135–146)
TOTAL PROTEIN: 6.8 g/dL (ref 6.1–8.1)
Total Bilirubin: 0.5 mg/dL (ref 0.2–1.2)

## 2016-07-27 LAB — CBC WITH DIFFERENTIAL/PLATELET
BASOS ABS: 0 {cells}/uL (ref 0–200)
Basophils Relative: 0 %
EOS ABS: 71 {cells}/uL (ref 15–500)
Eosinophils Relative: 1 %
HEMATOCRIT: 41.2 % (ref 35.0–45.0)
Hemoglobin: 13.8 g/dL (ref 11.7–15.5)
LYMPHS PCT: 18 %
Lymphs Abs: 1278 cells/uL (ref 850–3900)
MCH: 29.5 pg (ref 27.0–33.0)
MCHC: 33.5 g/dL (ref 32.0–36.0)
MCV: 88 fL (ref 80.0–100.0)
MONO ABS: 639 {cells}/uL (ref 200–950)
MONOS PCT: 9 %
MPV: 10.9 fL (ref 7.5–12.5)
Neutro Abs: 5112 cells/uL (ref 1500–7800)
Neutrophils Relative %: 72 %
PLATELETS: 178 10*3/uL (ref 140–400)
RBC: 4.68 MIL/uL (ref 3.80–5.10)
RDW: 13.7 % (ref 11.0–15.0)
WBC: 7.1 10*3/uL (ref 4.0–10.5)

## 2016-07-27 MED ORDER — LABETALOL HCL 200 MG PO TABS: 200.0000 mg | ORAL_TABLET | Freq: Two times a day (BID) | ORAL | 2 refills | Status: DC

## 2016-07-27 NOTE — Patient Instructions (Signed)

## 2016-07-27 NOTE — Progress Notes (Signed)
Subjective:    Patient ID: Melanie Love, female    DOB: 10/27/1985, 31 y.o.   MRN: [id number]  HPI Chief Complaint  Patient presents with  . new pt    new pt, cpe. sees obgyn for recent pregnancy so will go back there for pap. declines flu shot   She is new to the practice and here for a complete physical exam. Previous medical care: [facility] in [place] with [place].  Last CPE: 10 months ago she had a baby.   Is not breast feeding.   Other providers: [facility]   Past medical history: HTN- 5 years ago.   Social history: Lives with husband and child, works as a Advice worker Diet: low salt Excerise: none  Immunizations: flu shot- refused.   Health maintenance:  Mammogram: N/A Colonoscopy: N/A Last Pap Smear: 2016 Last Menstrual cycle: January Pregnancies: 1 Last Dental Exam: twice annually  Last Eye Exam: April 2017  Wears seatbelt always, uses sunscreen, smoke detectors in home and functioning, does not text while driving and feels safe in home environment.   Reviewed allergies, medications, past medical, surgical, family, and social history.     Review of Systems Review of Systems Constitutional: -fever, -chills, -sweats, -unexpected weight change,-fatigue ENT: -runny nose, -ear pain, -sore throat Cardiology:  -chest pain, -palpitations, -edema Respiratory: -cough, -shortness of breath, -wheezing Gastroenterology: -abdominal pain, -nausea, -vomiting, -diarrhea, -constipation  Hematology: -bleeding or bruising problems Musculoskeletal: -arthralgias, -myalgias, -joint swelling, -back pain Ophthalmology: -vision changes Urology: -dysuria, -difficulty urinating, -hematuria, -urinary frequency, -urgency Neurology: -headache, -weakness, -tingling, -numbness       Objective:   Physical Exam BP 120/80   Pulse 87   Ht 5' 4.5" (1.638 m)   Wt 130 lb 3.2 oz (59.1 kg)   LMP 06/26/2016 (Within Days)   BMI 22.00 kg/m   General  Appearance:    Alert, cooperative, no distress, appears stated age  Head:    Normocephalic, without obvious abnormality, atraumatic  Eyes:    PERRL, conjunctiva/corneas clear, EOM's intact, fundi    benign  Ears:    Normal TM's and external ear canals  Nose:   Nares normal, mucosa normal, no drainage or sinus   tenderness  Throat:   Lips, mucosa, and tongue normal; teeth and gums normal  Neck:   Supple, no lymphadenopathy;  thyroid:  no   enlargement/tenderness/nodules; no carotid   bruit or JVD  Back:    Spine nontender, no curvature, ROM normal, no CVA     tenderness  Lungs:     Clear to auscultation bilaterally without wheezes, rales or     ronchi; respirations unlabored  Chest Wall:    No tenderness or deformity   Heart:    Regular rate and rhythm, S1 and S2 normal, no murmur, rub   or gallop  Breast Exam:    OB/GYN  Abdomen:     Soft, non-tender, nondistended, normoactive bowel sounds,    no masses, no hepatosplenomegaly  Genitalia:    OB/GYN  Rectal:    Not performed due to age<40 and no related complaints  Extremities:   No clubbing, cyanosis or edema  Pulses:   2+ and symmetric all extremities  Skin:   Skin color, texture, turgor normal, no rashes or lesions  Lymph nodes:   Cervical, supraclavicular, and axillary nodes normal  Neurologic:   CNII-XII intact, normal strength, sensation and gait; reflexes 2+ and symmetric throughout          Psych:  Normal mood, affect, hygiene and grooming.    Urinalysis dipstick: negative      Assessment & Plan:  Routine general medical examination at a health care facility - Plan: Urinalysis Dipstick, CBC with Differential/Platelet, Comprehensive metabolic panel, TSH  Hypertension, unspecified type - Plan: CBC with Differential/Platelet, Comprehensive metabolic panel  Family history of thyroid disease in mother - Plan: TSH  Discussed that she appears to be doing well overall.  Discussed safety and health promotion.  She will continue  seeing her OB/GYN.  Blood pressure is within goal range. Refilled medication.  Plan to check labs and following up as appropriate.

## 2016-08-17 ENCOUNTER — Encounter: Payer: Self-pay | Admitting: Family Medicine

## 2016-08-17 ENCOUNTER — Ambulatory Visit (INDEPENDENT_AMBULATORY_CARE_PROVIDER_SITE_OTHER): Payer: PRIVATE HEALTH INSURANCE | Admitting: Family Medicine

## 2016-08-17 VITALS — BP 104/64 | HR 83 | Temp 98.3°F | Wt 129.2 lb

## 2016-08-17 DIAGNOSIS — J101 Influenza due to other identified influenza virus with other respiratory manifestations: Secondary | ICD-10-CM | POA: Diagnosis not present

## 2016-08-17 DIAGNOSIS — R52 Pain, unspecified: Secondary | ICD-10-CM | POA: Diagnosis not present

## 2016-08-17 LAB — POC INFLUENZA A&B (BINAX/QUICKVUE)
INFLUENZA B, POC: POSITIVE — AB
Influenza A, POC: NEGATIVE

## 2016-08-17 NOTE — Progress Notes (Signed)
   Subjective:    Patient ID: Melanie Love, female    DOB: 05/13/1986, 31 y.o.   MRN: [id number]  HPI Chief Complaint  Patient presents with  . possible flu    possible flu- started yesterday, fever,body aches, pain around eyes, tired, congestion   She is here with flu like symptoms that started yesterday morning with fever up to 102 and body aches. States she also has bilateral eye pain and nasal congestion and fatigue.  Denies dizziness, ear pain, sore throat, cough, chest pain, palpitations, shortness of breath, abdominal pain, N/V/D.   Her 3511 month old son had fever and URI illness last week. Unknown flu contact.  Did not get flu shot.  She is immunocompetent.   Reviewed allergies, medications, past medical, and social history.   Review of Systems Pertinent positives and negatives in the history of present illness.     Objective:   Physical Exam BP 104/64   Pulse 83   Temp 98.3 F (36.8 C) (Oral)   Wt 129 lb 3.2 oz (58.6 kg)   SpO2 97%   BMI 21.83 kg/m   Alert and in no distress. Mildly ill appearing. No sinus tenderness. Nares patent without edema or drainage. Tympanic membranes and canals are normal. Pharyngeal area is normal. Neck is supple without adenopathy or thyromegaly. Cardiac exam shows a regular sinus rhythm without murmurs or gallops. Lungs are clear to auscultation. Skin warm and dry. PERRLA, EOMs intact.        Assessment & Plan:  Influenza B  Body aches - Plan: POC Influenza A&B(BINAX/QUICKVUE)  Positive influenza test. Discussed management and treatment of illness. Offered Tamiflu, discussed possible side effects. Patient declines mediation and would like to do symptomatic treatment. Discussed potential worsening symptoms and complications of illness and when to call or return. She will call her son's pediatrician for advise on prophylactic treatment for him.  Follow up as needed.

## 2016-08-17 NOTE — Patient Instructions (Signed)

## 2016-08-25 ENCOUNTER — Ambulatory Visit (INDEPENDENT_AMBULATORY_CARE_PROVIDER_SITE_OTHER): Payer: PRIVATE HEALTH INSURANCE | Admitting: Medical

## 2016-08-25 ENCOUNTER — Encounter: Payer: Self-pay | Admitting: Medical

## 2016-08-25 VITALS — BP 120/80 | HR 90 | Temp 98.3°F | Wt 124.2 lb

## 2016-08-25 DIAGNOSIS — J019 Acute sinusitis, unspecified: Secondary | ICD-10-CM

## 2016-08-25 MED ORDER — AMOXICILLIN 875 MG PO TABS: 875.0000 mg | ORAL_TABLET | Freq: Two times a day (BID) | ORAL | 0 refills | Status: DC

## 2016-08-25 NOTE — Progress Notes (Signed)
Subjective: Chief Complaint  Patient presents with  . fever, headaches    fever , headaches, sinus presure in head.  she had  the flu last week   Here for not feeling well.  Was here last Thursday, was + for flu.  Stopped fever yesterday.  Feeling better in general compared to last week, but having sinus pressure, sinus pain, hoarse voice. Has tendency to get sinusitis on back end of respiratory infections.  Having teeth ache ,headache, decreased sense of smell and taste, having discolored nasal congestion.  Has bad cough, but this is somewhat improved, dry, nonproductive.   Had some sore throat, some right ear pain.   No SOB.   No NVD.  Using Advil.   No other aggravating or relieving factors. No other complaint.  Past Medical History:  Diagnosis Date  . Dizziness   . 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 H/O hematuria -Proteinuria   . Hypertension    Current Outpatient Prescriptions on File Prior to Visit  Medication Sig Dispense Refill  . labetalol (NORMODYNE) 200 MG tablet Take 1 tablet (200 mg total) by mouth 2 (two) times daily. 60 tablet 2  . Multiple Vitamin (MULTIVITAMIN) tablet Take 1 tablet by mouth daily.    . Omega-3 Fatty Acids (FISH OIL PO) Take by mouth.     No current facility-administered medications on file prior to visit.    ROS as in subjective   Objective: BP 120/80   Pulse 90   Temp 98.3 F (36.8 C)   Wt 124 lb 3.2 oz (56.3 kg)   LMP 07/29/2016 (Exact Date)   SpO2 99%   BMI 20.99 kg/m   General appearance: Alert, WD/WN, no distress                             Skin: warm, no rash                           Head: +maxillary sinus tenderness,                            Eyes: conjunctiva normal, corneas clear, PERRLA                            Ears: pearly TMs, external ear canals normal                          Nose: septum midline, turbinates swollen, with erythema and mucoid discharge             Mouth/throat: MMM, tongue normal, mild pharyngeal erythema                          Neck: supple, no adenopathy, no thyromegaly, non tender                         Lungs: CTA bilaterally, no wheezes, rales, or rhonchi       Assessment: Encounter Diagnosis  Name Primary?  . Acute non-recurrent sinusitis, unspecified location Yes    Plan Begin medications below,rest, hydrate well, nasal saline, can c/t Flonase, can use OTC Mucinex.   Call if not completely resolved within a week.  Pinkey was seen today for fever, headaches.  Diagnoses and  all orders for this visit:  Acute non-recurrent sinusitis, unspecified location  Other orders -     amoxicillin (AMOXIL) 875 MG tablet; Take 1 tablet (875 mg total) by mouth 2 (two) times daily.

## 2016-10-11 ENCOUNTER — Encounter: Payer: Self-pay | Admitting: Family Medicine

## 2016-10-23 ENCOUNTER — Other Ambulatory Visit: Payer: Self-pay | Admitting: Family Medicine

## 2017-02-21 ENCOUNTER — Other Ambulatory Visit: Payer: Self-pay | Admitting: Family Medicine

## 2017-02-21 NOTE — Telephone Encounter (Signed)
Is this okay to refill? 

## 2017-02-21 NOTE — Telephone Encounter (Signed)
ok 

## 2017-06-13 ENCOUNTER — Other Ambulatory Visit: Payer: Self-pay | Admitting: Family Medicine

## 2017-07-12 ENCOUNTER — Other Ambulatory Visit: Payer: Self-pay | Admitting: Family Medicine

## 2017-08-19 ENCOUNTER — Other Ambulatory Visit: Payer: Self-pay | Admitting: Family Medicine

## 2017-08-20 ENCOUNTER — Telehealth: Payer: Self-pay | Admitting: Family Medicine

## 2017-08-20 MED ORDER — LABETALOL HCL 200 MG PO TABS: 200.0000 mg | ORAL_TABLET | Freq: Two times a day (BID) | ORAL | 0 refills | Status: DC

## 2017-08-20 NOTE — Telephone Encounter (Signed)
Pt called and made a cpe appt for Monday march the 4th if we could refill her medicine until then

## 2017-08-20 NOTE — Telephone Encounter (Signed)
I have already refilled in a telephone call

## 2017-08-20 NOTE — Telephone Encounter (Signed)
Refilled med

## 2017-08-20 NOTE — Telephone Encounter (Signed)
Please call and have her schedule for an office visit since I have not seen her in a year for HTN.  Ok to refill her medication until she can get in. Thanks.

## 2017-08-20 NOTE — Telephone Encounter (Signed)
New Message  Pt is scheduled for a physical on 3.4.19  *STAT* If patient is at the pharmacy, call can be transferred to refill team.   1. Which medications need to be refilled? (please list name of each medication and dose if known)  labetalol (Normodyne) 200 mg tablet twice daily  2. Which pharmacy/location (including street and city if local pharmacy) is medication to be sent to? [facility], [address], [place], [place]  3. Do they need a 30 day or 90 day supply?  30 day supply

## 2017-08-27 ENCOUNTER — Ambulatory Visit: Payer: PRIVATE HEALTH INSURANCE | Admitting: Family Medicine

## 2017-08-27 ENCOUNTER — Encounter: Payer: Self-pay | Admitting: Family Medicine

## 2017-08-27 VITALS — BP 124/84 | HR 85 | Ht 66.5 in | Wt 131.0 lb

## 2017-08-27 DIAGNOSIS — R21 Rash and other nonspecific skin eruption: Secondary | ICD-10-CM | POA: Diagnosis not present

## 2017-08-27 DIAGNOSIS — I10 Essential (primary) hypertension: Secondary | ICD-10-CM

## 2017-08-27 DIAGNOSIS — Z0000 Encounter for general adult medical examination without abnormal findings: Secondary | ICD-10-CM | POA: Diagnosis not present

## 2017-08-27 LAB — POCT URINALYSIS DIP (PROADVANTAGE DEVICE)
BILIRUBIN UA: NEGATIVE
BILIRUBIN UA: NEGATIVE mg/dL
GLUCOSE UA: NEGATIVE mg/dL
Leukocytes, UA: NEGATIVE
Nitrite, UA: NEGATIVE
Protein Ur, POC: NEGATIVE mg/dL
RBC UA: NEGATIVE
SPECIFIC GRAVITY, URINE: 1.02
Urobilinogen, Ur: NEGATIVE
pH, UA: 6 (ref 5.0–8.0)

## 2017-08-27 MED ORDER — MUPIROCIN 2 % EX OINT: 1.0000 "application " | TOPICAL_OINTMENT | Freq: Two times a day (BID) | CUTANEOUS | 0 refills | Status: DC

## 2017-08-27 NOTE — Progress Notes (Signed)
Subjective:    Patient ID: Melanie Love, female    DOB: October 10, 1985, 32 y.o.   MRN: [id number]  HPI Chief Complaint  Patient presents with  . cpe    nonfasting cpe   She is here for a complete physical exam.  Complains of a rash on her face for the past 2 months. States the rash initially started below her nose and has spread to her chin and now to the edges of her eyes. The rash "burns". She has not tried anything for it. Denies any new lotions, soaps, make up or chemicals.  States she has a Armed forces operational officer but has not seen him in a while.   Other providers: [facility]N - [facility]   Social history: Lives with husband and son who is almost 2. works as a Advice worker   Diet: fairly healthy and cutting back on sugar Excerise: nothing   Immunizations: flu and Tdap up to date.   Health maintenance:  Last Gynecological Exam: 1 1/2 years ago Last Menstrual cycle: 07/30/2017 Pregnancies: 1  Wears seatbelt always, uses sunscreen, smoke detectors in home and functioning, does not text while driving and feels safe in home environment.   Reviewed allergies, medications, past medical, surgical, family, and social history.    Review of Systems Review of Systems Constitutional: -fever, -chills, -sweats, -unexpected weight change,-fatigue ENT: -runny nose, -ear pain, -sore throat Cardiology:  -chest pain, -palpitations, -edema Respiratory: -cough, -shortness of breath, -wheezing Gastroenterology: -abdominal pain, -nausea, -vomiting, -diarrhea, -constipation  Hematology: -bleeding or bruising problems Musculoskeletal: -arthralgias, -myalgias, -joint swelling, -back pain Ophthalmology: -vision changes Urology: -dysuria, -difficulty urinating, -hematuria, -urinary frequency, -urgency Neurology: -headache, -weakness, -tingling, -numbness       Objective:   Physical Exam BP 124/84   Pulse 85   Ht 5' 6.5" (1.689 m)   Wt 131 lb (59.4 kg)   LMP 07/30/2017   Breastfeeding?  No   BMI 20.83 kg/m   General Appearance:    Alert, cooperative, no distress, appears stated age  Head:    Normocephalic, without obvious abnormality, atraumatic  Eyes:    PERRLA, conjunctiva/corneas clear, EOM's intact, fundi    benign  Ears:    Normal TM's and external ear canals  Nose:   Nares normal, mucosa normal, no drainage or sinus   tenderness  Throat:   Lips, mucosa, and tongue normal; teeth and gums normal  Neck:   Supple, no lymphadenopathy;  thyroid:  no   enlargement/tenderness/nodules; no carotid   bruit or JVD  Back:    Spine nontender, no curvature, ROM normal, no CVA     tenderness  Lungs:     Clear to auscultation bilaterally without wheezes, rales or     ronchi; respirations unlabored  Chest Wall:    No tenderness or deformity   Heart:    Regular rate and rhythm, S1 and S2 normal, no murmur, rub   or gallop  Breast Exam:    OB/GYN  Abdomen:     Soft, non-tender, nondistended, normoactive bowel sounds,    no masses, no hepatosplenomegaly  Genitalia:    Ob/GYN. Pap smear up to date per patient.   Rectal:    Not performed due to age<40 and no related complaints  Extremities:   No clubbing, cyanosis or edema  Pulses:   2+ and symmetric all extremities  Skin:   Skin color, texture, turgor normal, numerous nevi. Rash on face with erythema, papules and some pustules even to her chin, below her nose  and below bilateral eyes.   Lymph nodes:   Cervical, supraclavicular, and axillary nodes normal  Neurologic:   CNII-XII intact, normal strength, sensation and gait; reflexes 2+ and symmetric throughout          Psych:   Normal mood, affect, hygiene and grooming.     Urinalysis dipstick: negative      Assessment & Plan:  Routine general medical examination at a health care facility - Plan: CBC with Differential/Platelet, Comprehensive metabolic panel, POCT Urinalysis DIP (Proadvantage Device)  Hypertension, unspecified type - Plan: CBC with Differential/Platelet,  Comprehensive metabolic panel  Rash and nonspecific skin eruption - Plan: mupirocin ointment (BACTROBAN) 2 %  She appears to be doing well. Unclear what is causing the rash on her face but appears to be an early infection and will treat with Bactroban. If this is not clearing up the race, she will see her dermatologist.  She will avoid any make up, new creams, lotions, soaps for now.  BP is close to goal range. Continue on current medication. I am not switching her to a different class of HTN medication due to possibility of pregnancy in the near future.  Up to date on immunizations and health maintenance.  Follow up pending labs.

## 2017-08-27 NOTE — Patient Instructions (Addendum)
Call and schedule with your dermatologist and you can try the Bactroban in the meantime to see if this helps.   Call and schedule with your OB/GYN.   Start getting at least 150 minutes of physical activity.   We will call you with your lab results.     Preventative Care for Adults - Female      MAINTAIN REGULAR HEALTH EXAMS:  A routine yearly physical is a good way to check in with your primary care provider about your health and preventive screening. It is also an opportunity to share updates about your health and any concerns you have, and receive a thorough all-over exam.   Most health insurance companies pay for at least some preventative services.  Check with your health plan for specific coverages.  WHAT PREVENTATIVE SERVICES DO WOMEN NEED?  Adult women should have their weight and blood pressure checked regularly.   Women age 30 and older should have their cholesterol levels checked regularly.  Women should be screened for cervical cancer with a Pap smear and pelvic exam beginning at age 30.   Breast cancer screening generally begins at age 30 with a mammogram and breast exam by your primary care provider.    Beginning at age 30 and continuing to age 30, women should be screened for colorectal cancer.  Certain people may need continued testing until age 30.  Updating vaccinations is part of preventative care.  Vaccinations help protect against diseases such as the flu.  Osteoporosis is a disease in which the bones lose minerals and strength as we age. Women ages 6965 and over should discuss this with their caregivers, as should women after menopause who have other risk factors.  Lab tests are generally done as part of preventative care to screen for anemia and blood disorders, to screen for problems with the kidneys and liver, to screen for bladder problems, to check blood sugar, and to check your cholesterol level.  Preventative services generally include counseling about  diet, exercise, avoiding tobacco, drugs, excessive alcohol consumption, and sexually transmitted infections.    GENERAL RECOMMENDATIONS FOR GOOD HEALTH:  Healthy diet:  Eat a variety of foods, including fruit, vegetables, animal or vegetable protein, such as meat, fish, chicken, and eggs, or beans, lentils, tofu, and grains, such as rice.  Drink plenty of water daily.  Decrease saturated fat in the diet, avoid lots of red meat, processed foods, sweets, fast foods, and fried foods.  Exercise:  Aerobic exercise helps maintain good heart health. At least 30-40 minutes of moderate-intensity exercise is recommended. For example, a brisk walk that increases your heart rate and breathing. This should be done on most days of the week.   Find a type of exercise or a variety of exercises that you enjoy so that it becomes a part of your daily life.  Examples are running, walking, swimming, water aerobics, and biking.  For motivation and support, explore group exercise such as aerobic class, spin class, Zumba, Yoga,or  martial arts, etc.    Set exercise goals for yourself, such as a certain weight goal, walk or run in a race such as a 5k walk/run.  Speak to your primary care provider about exercise goals.  Disease prevention:  If you smoke or chew tobacco, find out from your caregiver how to quit. It can literally save your life, no matter how long you have been a tobacco user. If you do not use tobacco, never begin.   Maintain a healthy diet and  normal weight. Increased weight leads to problems with blood pressure and diabetes.   The Body Mass Index or BMI is a way of measuring how much of your body is fat. Having a BMI above 27 increases the risk of heart disease, diabetes, hypertension, stroke and other problems related to obesity. Your caregiver can help determine your BMI and based on it develop an exercise and dietary program to help you achieve or maintain this important measurement at a  healthful level.  High blood pressure causes heart and blood vessel problems.  Persistent high blood pressure should be treated with medicine if weight loss and exercise do not work.   Fat and cholesterol leaves deposits in your arteries that can block them. This causes heart disease and vessel disease elsewhere in your body.  If your cholesterol is found to be high, or if you have heart disease or certain other medical conditions, then you may need to have your cholesterol monitored frequently and be treated with medication.   Ask if you should have a cardiac stress test if your history suggests this. A stress test is a test done on a treadmill that looks for heart disease. This test can find disease prior to there being a problem.  Menopause can be associated with physical symptoms and risks. Hormone replacement therapy is available to decrease these. You should talk to your caregiver about whether starting or continuing to take hormones is right for you.   Osteoporosis is a disease in which the bones lose minerals and strength as we age. This can result in serious bone fractures. Risk of osteoporosis can be identified using a bone density scan. Women ages 87 and over should discuss this with their caregivers, as should women after menopause who have other risk factors. Ask your caregiver whether you should be taking a calcium supplement and Vitamin D, to reduce the rate of osteoporosis.   Avoid drinking alcohol in excess (more than two drinks per day).  Avoid use of street drugs. Do not share needles with anyone. Ask for professional help if you need assistance or instructions on stopping the use of alcohol, cigarettes, and/or drugs.  Brush your teeth twice a day with fluoride toothpaste, and floss once a day. Good oral hygiene prevents tooth decay and gum disease. The problems can be painful, unattractive, and can cause other health problems. Visit your dentist for a routine oral and dental check  up and preventive care every 6-12 months.   Look at your skin regularly.  Use a mirror to look at your back. Notify your caregivers of changes in moles, especially if there are changes in shapes, colors, a size larger than a pencil eraser, an irregular border, or development of new moles.  Safety:  Use seatbelts 100% of the time, whether driving or as a passenger.  Use safety devices such as hearing protection if you work in environments with loud noise or significant background noise.  Use safety glasses when doing any work that could send debris in to the eyes.  Use a helmet if you ride a bike or motorcycle.  Use appropriate safety gear for contact sports.  Talk to your caregiver about gun safety.  Use sunscreen with a SPF (or skin protection factor) of 15 or greater.  Lighter skinned people are at a greater risk of skin cancer. Don't forget to also wear sunglasses in order to protect your eyes from too much damaging sunlight. Damaging sunlight can accelerate cataract formation.   Practice safe  sex. Use condoms. Condoms are used for birth control and to help reduce the spread of sexually transmitted infections (or STIs).  Some of the STIs are gonorrhea (the clap), chlamydia, syphilis, trichomonas, herpes, HPV (human papilloma virus) and HIV (human immunodeficiency virus) which causes AIDS. The herpes, HIV and HPV are viral illnesses that have no cure. These can result in disability, cancer and death.   Keep carbon monoxide and smoke detectors in your home functioning at all times. Change the batteries every 6 months or use a model that plugs into the wall.   Vaccinations:  Stay up to date with your tetanus shots and other required immunizations. You should have a booster for tetanus every 10 years. Be sure to get your flu shot every year, since 5%-20% of the [place] population comes down with the flu. The flu vaccine changes each year, so being vaccinated once is not enough. Get your shot in the fall,  before the flu season peaks.   Other vaccines to consider:  Human Papilloma Virus or HPV causes cancer of the cervix, and other infections that can be transmitted from person to person. There is a vaccine for HPV, and females should get immunized between the ages of 89 and 59. It requires a series of 3 shots.   Pneumococcal vaccine to protect against certain types of pneumonia.  This is normally recommended for adults age 64 or older.  However, adults younger than 30 years old with certain underlying conditions such as diabetes, heart or lung disease should also receive the vaccine.  Shingles vaccine to protect against Varicella Zoster if you are older than age 35, or younger than 30 years old with certain underlying illness.  Hepatitis A vaccine to protect against a form of infection of the liver by a virus acquired from food.  Hepatitis B vaccine to protect against a form of infection of the liver by a virus acquired from blood or body fluids, particularly if you work in health care.  If you plan to travel internationally, check with your local health department for specific vaccination recommendations.  Cancer Screening:  Breast cancer screening is essential to preventive care for women. All women age 54 and older should perform a breast self-exam every month. At age 39 and older, women should have their caregiver complete a breast exam each year. Women at ages 41 and older should have a mammogram (x-ray film) of the breasts. Your caregiver can discuss how often you need mammograms.    Cervical cancer screening includes taking a Pap smear (sample of cells examined under a microscope) from the cervix (end of the uterus). It also includes testing for HPV (Human Papilloma Virus, which can cause cervical cancer). Screening and a pelvic exam should begin at age 59, or 3 years after a woman becomes sexually active. Screening should occur every year, with a Pap smear but no HPV testing, up to age 59.  After age 21, you should have a Pap smear every 3 years with HPV testing, if no HPV was found previously.   Most routine colon cancer screening begins at the age of 24. On a yearly basis, doctors may provide special easy to use take-home tests to check for hidden blood in the stool. Sigmoidoscopy or colonoscopy can detect the earliest forms of colon cancer and is life saving. These tests use a small camera at the end of a tube to directly examine the colon. Speak to your caregiver about this at age 63, when routine screening  begins (and is repeated every 5 years unless early forms of pre-cancerous polyps or small growths are found).

## 2017-08-28 LAB — CBC WITH DIFFERENTIAL/PLATELET
BASOS ABS: 0 10*3/uL (ref 0.0–0.2)
Basos: 0 %
EOS (ABSOLUTE): 0.1 10*3/uL (ref 0.0–0.4)
Eos: 2 %
Hematocrit: 40 % (ref 34.0–46.6)
Hemoglobin: 13.8 g/dL (ref 11.1–15.9)
IMMATURE GRANS (ABS): 0 10*3/uL (ref 0.0–0.1)
IMMATURE GRANULOCYTES: 0 %
LYMPHS: 24 %
Lymphocytes Absolute: 1.4 10*3/uL (ref 0.7–3.1)
MCH: 30.5 pg (ref 26.6–33.0)
MCHC: 34.5 g/dL (ref 31.5–35.7)
MCV: 89 fL (ref 79–97)
MONOS ABS: 0.4 10*3/uL (ref 0.1–0.9)
Monocytes: 7 %
NEUTROS PCT: 67 %
Neutrophils Absolute: 3.9 10*3/uL (ref 1.4–7.0)
PLATELETS: 211 10*3/uL (ref 150–379)
RBC: 4.52 x10E6/uL (ref 3.77–5.28)
RDW: 13.6 % (ref 12.3–15.4)
WBC: 5.8 10*3/uL (ref 3.4–10.8)

## 2017-08-28 LAB — COMPREHENSIVE METABOLIC PANEL
A/G RATIO: 2.2 (ref 1.2–2.2)
ALT: 16 IU/L (ref 0–32)
AST: 18 IU/L (ref 0–40)
Albumin: 4.9 g/dL (ref 3.5–5.5)
Alkaline Phosphatase: 59 IU/L (ref 39–117)
BILIRUBIN TOTAL: 0.4 mg/dL (ref 0.0–1.2)
BUN/Creatinine Ratio: 14 (ref 9–23)
BUN: 11 mg/dL (ref 6–20)
CALCIUM: 9.9 mg/dL (ref 8.7–10.2)
CHLORIDE: 104 mmol/L (ref 96–106)
CO2: 22 mmol/L (ref 20–29)
Creatinine, Ser: 0.76 mg/dL (ref 0.57–1.00)
GFR, EST AFRICAN AMERICAN: 123 mL/min/{1.73_m2} (ref >59)
GFR, EST NON AFRICAN AMERICAN: 106 mL/min/{1.73_m2} (ref >59)
GLUCOSE: 81 mg/dL (ref 65–99)
Globulin, Total: 2.2 g/dL (ref 1.5–4.5)
POTASSIUM: 4.2 mmol/L (ref 3.5–5.2)
Sodium: 142 mmol/L (ref 134–144)
TOTAL PROTEIN: 7.1 g/dL (ref 6.0–8.5)

## 2017-09-11 ENCOUNTER — Telehealth: Payer: Self-pay | Admitting: Family Medicine

## 2017-09-11 MED ORDER — LABETALOL HCL 200 MG PO TABS: 200.0000 mg | ORAL_TABLET | Freq: Two times a day (BID) | ORAL | 1 refills | Status: DC

## 2017-09-11 NOTE — Telephone Encounter (Signed)
Ok

## 2017-09-11 NOTE — Telephone Encounter (Signed)
Done. Refilled for 6 months

## 2017-09-11 NOTE — Telephone Encounter (Signed)
New Message   *STAT* If patient is at the pharmacy, call can be transferred to refill team.   1. Which medications need to be refilled? (please list name of each medication and dose if known)  Labetalol HCL 200 mg tablet twice daily  2. Which pharmacy/location (including street and city if local pharmacy) is medication to be sent to? [facility] [address], [address], [place], [place] [phone]  3. Do they need a 30 day or 90 day supply?  90 day supply

## 2017-09-11 NOTE — Telephone Encounter (Signed)
Is this ok to refill?  

## 2018-03-15 ENCOUNTER — Other Ambulatory Visit: Payer: Self-pay | Admitting: Family Medicine

## 2018-09-09 ENCOUNTER — Other Ambulatory Visit: Payer: Self-pay | Admitting: Family Medicine

## 2018-09-09 NOTE — Telephone Encounter (Signed)
Pt is overdue for cpe but I have scheduled her for a couple months out and vickie oked to refill her med until appt

## 2018-11-07 ENCOUNTER — Other Ambulatory Visit: Payer: Self-pay

## 2018-11-07 ENCOUNTER — Ambulatory Visit (INDEPENDENT_AMBULATORY_CARE_PROVIDER_SITE_OTHER): Payer: PRIVATE HEALTH INSURANCE | Admitting: Family Medicine

## 2018-11-07 ENCOUNTER — Encounter: Payer: Self-pay | Admitting: Family Medicine

## 2018-11-07 VITALS — BP 120/64 | HR 65 | Temp 98.0°F | Ht 65.0 in | Wt 133.6 lb

## 2018-11-07 DIAGNOSIS — Z8639 Personal history of other endocrine, nutritional and metabolic disease: Secondary | ICD-10-CM | POA: Insufficient documentation

## 2018-11-07 DIAGNOSIS — Z0000 Encounter for general adult medical examination without abnormal findings: Secondary | ICD-10-CM

## 2018-11-07 DIAGNOSIS — Z8349 Family history of other endocrine, nutritional and metabolic diseases: Secondary | ICD-10-CM | POA: Diagnosis not present

## 2018-11-07 DIAGNOSIS — I10 Essential (primary) hypertension: Secondary | ICD-10-CM

## 2018-11-07 HISTORY — DX: Personal history of other endocrine, nutritional and metabolic disease: Z86.39

## 2018-11-07 NOTE — Progress Notes (Signed)
Subjective:    Patient ID: Melanie Love, female    DOB: 10-Jun-1986, 33 y.o.   MRN: [id number]  HPI  Chief Complaint  Patient presents with  . physical    physical- will see obgyn so she can try to get pregnant again this summer. gets eyes checked yearly.     She is here for a complete physical exam.  Reports having occasional flare ups of sciatica. Sees a chiropractor for this.   Considering having another baby and will see OB/GYN soon.   Other providers: OB/GYN- Nestor Ramp   HTN since pregnancy 3 years ago. No issues.   Social history: Lives with hsuband, works as a Comptroller  Denies smoking, drug use. Drinks alcohol socially.  Diet: healthy  Excerise: walking   Immunizations: UTD  Health maintenance:  Last Gynecological Exam: 2 years ago Last Menstrual cycle: Oct 25, 2018 Pregnancies: 1 Last Dental Exam: every 6 months  Last Eye Exam: in the past 6 months   Wears seatbelt always, uses sunscreen, smoke detectors in home and functioning, does not text while driving and feels safe in home environment.   Reviewed allergies, medications, past medical, surgical, family, and social history.    Review of Systems Review of Systems Constitutional: -fever, -chills, -sweats, -unexpected weight change,-fatigue ENT: -runny nose, -ear pain, -sore throat Cardiology:  -chest pain, -palpitations, -edema Respiratory: -cough, -shortness of breath, -wheezing Gastroenterology: -abdominal pain, -nausea, -vomiting, -diarrhea, -constipation  Hematology: -bleeding or bruising problems Musculoskeletal: -arthralgias, -myalgias, -joint swelling, -back pain Ophthalmology: -vision changes Urology: -dysuria, -difficulty urinating, -hematuria, -urinary frequency, -urgency Neurology: -headache, -weakness, -tingling, -numbness       Objective:   Physical Exam BP 120/64   Pulse 65   Temp 98 F (36.7 C)   Ht 5\' 5"  (1.651 m)   Wt 133 lb 9.6 oz (60.6 kg)   LMP 10/25/2018   BMI 22.23  kg/m   General Appearance:    Alert, cooperative, no distress, appears stated age  Head:    Normocephalic, without obvious abnormality, atraumatic  Eyes:    PERRL, conjunctiva/corneas clear, EOM's intact, fundi    benign  Ears:    Normal TM's and external ear canals  Nose:   Nares normal, mucosa normal, no drainage or sinus   tenderness  Throat:   Lips, mucosa, and tongue normal; teeth and gums normal  Neck:   Supple, no lymphadenopathy;  thyroid:  no   enlargement/tenderness/nodules; no carotid   bruit or JVD  Back:    Spine nontender, no curvature, ROM normal, no CVA     tenderness  Lungs:     Clear to auscultation bilaterally without wheezes, rales or     ronchi; respirations unlabored  Chest Wall:    No tenderness or deformity   Heart:    Regular rate and rhythm, S1 and S2 normal, no murmur, rub   or gallop  Breast Exam:    OB/GYN  Abdomen:     Soft, non-tender, nondistended, normoactive bowel sounds,    no masses, no hepatosplenomegaly  Genitalia:    OB/GYN   Rectal:    Not performed due to age<40 and no related complaints  Extremities:   No clubbing, cyanosis or edema  Pulses:   2+ and symmetric all extremities  Skin:   Skin color, texture, turgor normal, no rashes or lesions  Lymph nodes:   Cervical, supraclavicular, and axillary nodes normal  Neurologic:   CNII-XII intact, normal strength, sensation and gait; reflexes 2+ and symmetric throughout  Psych:   Normal mood, affect, hygiene and grooming.         Assessment & Plan:  Routine general medical examination at a health care facility - Plan: CBC with Differential/Platelet, Comprehensive metabolic panel, TSH, T4, free, Lipid panel  Essential hypertension  Family history of thyroid disease in mother - Plan: TSH, T4, free  History of hyperlipidemia - Plan: Lipid panel  Waldo LaineKaitlin is here today for a CPE.  States she last ate 2 to 3 hours before her visit.  She appears to be in good health. Blood pressures are  well controlled on current treatment. Reports history of hyperlipidemia and would like to have labs today including lipids. Counseling on healthy diet and exercise. She is taking folic acid and plans to see OB/GYN to discuss the possibility of pregnancy in the future. Immunizations appear to be up-to-date. I did did give her some exercises to do for sciatica. Follow-up pending labs.

## 2018-11-07 NOTE — Patient Instructions (Addendum)
Folic acid 604 mcg daily is recommended while attempting pregnancy.   You can google sciatica stretches (6 of them) and Melanie Love is a good source.   We will call you with your results.    Preventive Care 18-39 Years, Female Preventive care refers to lifestyle choices and visits with your health care provider that can promote health and wellness. What does preventive care include?   A yearly physical exam. This is also called an annual well check.  Dental exams once or twice a year.  Routine eye exams. Ask your health care provider how often you should have your eyes checked.  Personal lifestyle choices, including: ? Daily care of your teeth and gums. ? Regular physical activity. ? Eating a healthy diet. ? Avoiding tobacco and drug use. ? Limiting alcohol use. ? Practicing safe sex. ? Taking vitamin and mineral supplements as recommended by your health care provider. What happens during an annual well check? The services and screenings done by your health care provider during your annual well check will depend on your age, overall health, lifestyle risk factors, and family history of disease. Counseling Your health care provider may ask you questions about your:  Alcohol use.  Tobacco use.  Drug use.  Emotional well-being.  Home and relationship well-being.  Sexual activity.  Eating habits.  Work and work Statistician.  Method of birth control.  Menstrual cycle.  Pregnancy history. Screening You may have the following tests or measurements:  Height, weight, and BMI.  Diabetes screening. This is done by checking your blood sugar (glucose) after you have not eaten for a while (fasting).  Blood pressure.  Lipid and cholesterol levels. These may be checked every 5 years starting at age 20.  Skin check.  Hepatitis C blood test.  Hepatitis B blood test.  Sexually transmitted disease (STD) testing.  BRCA-related cancer screening. This may be done if  you have a family history of breast, ovarian, tubal, or peritoneal cancers.  Pelvic exam and Pap test. This may be done every 3 years starting at age 55. Starting at age 30, this may be done every 5 years if you have a Pap test in combination with an HPV test. Discuss your test results, treatment options, and if necessary, the need for more tests with your health care provider. Vaccines Your health care provider may recommend certain vaccines, such as:  Influenza vaccine. This is recommended every year.  Tetanus, diphtheria, and acellular pertussis (Tdap, Td) vaccine. You may need a Td booster every 10 years.  Varicella vaccine. You may need this if you have not been vaccinated.  HPV vaccine. If you are 93 or younger, you may need three doses over 6 months.  Measles, mumps, and rubella (MMR) vaccine. You may need at least one dose of MMR. You may also need a second dose.  Pneumococcal 13-valent conjugate (PCV13) vaccine. You may need this if you have certain conditions and were not previously vaccinated.  Pneumococcal polysaccharide (PPSV23) vaccine. You may need one or two doses if you smoke cigarettes or if you have certain conditions.  Meningococcal vaccine. One dose is recommended if you are age 65-21 years and a first-year college student living in a residence hall, or if you have one of several medical conditions. You may also need additional booster doses.  Hepatitis A vaccine. You may need this if you have certain conditions or if you travel or work in places where you may be exposed to hepatitis A.  Hepatitis B vaccine.  You may need this if you have certain conditions or if you travel or work in places where you may be exposed to hepatitis B.  Haemophilus influenzae type b (Hib) vaccine. You may need this if you have certain risk factors. Talk to your health care provider about which screenings and vaccines you need and how often you need them. This information is not intended  to replace advice given to you by your health care provider. Make sure you discuss any questions you have with your health care provider. Document Released: 08/08/2001 Document Revised: 01/23/2017 Document Reviewed: 04/13/2015 Elsevier Interactive Patient Education  2019 [employer].

## 2018-11-08 LAB — LIPID PANEL
Chol/HDL Ratio: 3 ratio (ref 0.0–4.4)
Cholesterol, Total: 217 mg/dL — ABNORMAL HIGH (ref 100–199)
HDL: 73 mg/dL (ref >39)
LDL Calculated: 132 mg/dL — ABNORMAL HIGH (ref 0–99)
Triglycerides: 62 mg/dL (ref 0–149)
VLDL Cholesterol Cal: 12 mg/dL (ref 5–40)

## 2018-11-08 LAB — CBC WITH DIFFERENTIAL/PLATELET
Basophils Absolute: 0 10*3/uL (ref 0.0–0.2)
Basos: 1 %
EOS (ABSOLUTE): 0.1 10*3/uL (ref 0.0–0.4)
Eos: 1 %
Hematocrit: 40.3 % (ref 34.0–46.6)
Hemoglobin: 14.2 g/dL (ref 11.1–15.9)
Immature Grans (Abs): 0 10*3/uL (ref 0.0–0.1)
Immature Granulocytes: 0 %
Lymphocytes Absolute: 1.5 10*3/uL (ref 0.7–3.1)
Lymphs: 24 %
MCH: 31.1 pg (ref 26.6–33.0)
MCHC: 35.2 g/dL (ref 31.5–35.7)
MCV: 88 fL (ref 79–97)
Monocytes Absolute: 0.4 10*3/uL (ref 0.1–0.9)
Monocytes: 6 %
Neutrophils Absolute: 4.1 10*3/uL (ref 1.4–7.0)
Neutrophils: 68 %
Platelets: 218 10*3/uL (ref 150–450)
RBC: 4.57 x10E6/uL (ref 3.77–5.28)
RDW: 12.5 % (ref 11.7–15.4)
WBC: 6.1 10*3/uL (ref 3.4–10.8)

## 2018-11-08 LAB — COMPREHENSIVE METABOLIC PANEL
ALT: 18 IU/L (ref 0–32)
AST: 18 IU/L (ref 0–40)
Albumin/Globulin Ratio: 2.4 — ABNORMAL HIGH (ref 1.2–2.2)
Albumin: 4.8 g/dL (ref 3.9–5.0)
Alkaline Phosphatase: 47 IU/L (ref 39–117)
BUN/Creatinine Ratio: 18 (ref 9–23)
BUN: 15 mg/dL (ref 6–20)
Bilirubin Total: 0.4 mg/dL (ref 0.0–1.2)
CO2: 25 mmol/L (ref 20–29)
Calcium: 10.1 mg/dL (ref 8.7–10.2)
Chloride: 101 mmol/L (ref 96–106)
Creatinine, Ser: 0.85 mg/dL (ref 0.57–1.00)
GFR calc Af Amer: 106 mL/min/{1.73_m2} (ref >59)
GFR calc non Af Amer: 92 mL/min/{1.73_m2} (ref >59)
Globulin, Total: 2 g/dL (ref 1.5–4.5)
Glucose: 100 mg/dL — ABNORMAL HIGH (ref 65–99)
Potassium: 4.3 mmol/L (ref 3.5–5.2)
Sodium: 139 mmol/L (ref 134–144)
Total Protein: 6.8 g/dL (ref 6.0–8.5)

## 2018-11-08 LAB — TSH: TSH: 1.35 u[IU]/mL (ref 0.450–4.500)

## 2018-11-08 LAB — T4, FREE: Free T4: 1.24 ng/dL (ref 0.82–1.77)

## 2018-12-06 ENCOUNTER — Other Ambulatory Visit: Payer: Self-pay | Admitting: Family Medicine

## 2019-03-09 ENCOUNTER — Other Ambulatory Visit: Payer: Self-pay | Admitting: Family Medicine

## 2019-05-19 LAB — OB RESULTS CONSOLE HIV ANTIBODY (ROUTINE TESTING): HIV: NONREACTIVE

## 2019-05-19 LAB — OB RESULTS CONSOLE ABO/RH: RH Type: POSITIVE

## 2019-05-19 LAB — OB RESULTS CONSOLE HEPATITIS B SURFACE ANTIGEN: Hepatitis B Surface Ag: NEGATIVE

## 2019-05-19 LAB — OB RESULTS CONSOLE ANTIBODY SCREEN: Antibody Screen: NEGATIVE

## 2019-05-19 LAB — OB RESULTS CONSOLE GC/CHLAMYDIA
Chlamydia: NEGATIVE
Gonorrhea: NEGATIVE

## 2019-05-19 LAB — OB RESULTS CONSOLE RUBELLA ANTIBODY, IGM: Rubella: IMMUNE

## 2019-05-19 LAB — OB RESULTS CONSOLE RPR: RPR: NONREACTIVE

## 2019-05-27 ENCOUNTER — Other Ambulatory Visit: Payer: Self-pay

## 2019-05-27 DIAGNOSIS — Z20822 Contact with and (suspected) exposure to covid-19: Secondary | ICD-10-CM

## 2019-05-29 LAB — NOVEL CORONAVIRUS, NAA: SARS-CoV-2, NAA: NOT DETECTED

## 2019-06-27 NOTE — L&D Delivery Note (Signed)
Delivery Note Pt reached complete dilation and pushed great about 15 minutes.  At 6:17 PM a healthy female was delivered via Vaginal, Spontaneous (Presentation: Right Occiput Anterior).  APGAR: 8, 9; weight pending .   Placenta status: Spontaneous, Intact.  Cord: 3 vessels with the following complications: Nuchal x 1 delivered through.   Anesthesia: Epidural Episiotomy: None Lacerations:  Second degree Suture Repair: 3.0 vicryl rapide Est. Blood Loss (mL):   Mom to postpartum.  Baby to Couplet care / Skin to Skin. D/w pt circumcision and they desire to proceed.  [name] 12/19/2019, 6:42 PM

## 2019-10-20 ENCOUNTER — Other Ambulatory Visit (HOSPITAL_COMMUNITY): Payer: Self-pay | Admitting: Obstetrics and Gynecology

## 2019-10-20 DIAGNOSIS — Z363 Encounter for antenatal screening for malformations: Secondary | ICD-10-CM

## 2019-10-20 DIAGNOSIS — O4403 Placenta previa specified as without hemorrhage, third trimester: Secondary | ICD-10-CM

## 2019-10-20 DIAGNOSIS — Z3A31 31 weeks gestation of pregnancy: Secondary | ICD-10-CM

## 2019-10-21 ENCOUNTER — Encounter (HOSPITAL_COMMUNITY): Payer: Self-pay | Admitting: *Deleted

## 2019-10-22 ENCOUNTER — Other Ambulatory Visit: Payer: Self-pay

## 2019-10-22 ENCOUNTER — Ambulatory Visit (HOSPITAL_COMMUNITY): Payer: No Typology Code available for payment source | Admitting: *Deleted

## 2019-10-22 ENCOUNTER — Encounter (HOSPITAL_COMMUNITY): Payer: Self-pay

## 2019-10-22 ENCOUNTER — Ambulatory Visit (HOSPITAL_COMMUNITY)
Admission: RE | Admit: 2019-10-22 | Discharge: 2019-10-22 | Disposition: A | Discharge status: Home / Self Care | Payer: No Typology Code available for payment source | Source: Ambulatory Visit | Attending: Obstetrics and Gynecology | Admitting: Obstetrics and Gynecology

## 2019-10-22 VITALS — BP 125/77 | HR 72 | Temp 97.7°F | Ht 64.0 in

## 2019-10-22 DIAGNOSIS — Z363 Encounter for antenatal screening for malformations: Secondary | ICD-10-CM | POA: Diagnosis not present

## 2019-10-22 DIAGNOSIS — O4403 Placenta previa specified as without hemorrhage, third trimester: Secondary | ICD-10-CM | POA: Insufficient documentation

## 2019-10-22 DIAGNOSIS — Z3A31 31 weeks gestation of pregnancy: Secondary | ICD-10-CM | POA: Diagnosis not present

## 2019-10-24 ENCOUNTER — Encounter (HOSPITAL_COMMUNITY): Payer: Self-pay | Admitting: Obstetrics and Gynecology

## 2019-11-25 LAB — OB RESULTS CONSOLE GBS: GBS: NEGATIVE

## 2019-12-17 ENCOUNTER — Other Ambulatory Visit (HOSPITAL_COMMUNITY)
Admission: RE | Admit: 2019-12-17 | Discharge: 2019-12-17 | Disposition: A | Discharge status: Home / Self Care | Payer: No Typology Code available for payment source | Source: Ambulatory Visit | Attending: Obstetrics and Gynecology | Admitting: Obstetrics and Gynecology

## 2019-12-17 ENCOUNTER — Encounter (HOSPITAL_COMMUNITY): Payer: Self-pay | Admitting: *Deleted

## 2019-12-17 ENCOUNTER — Telehealth (HOSPITAL_COMMUNITY): Payer: Self-pay | Admitting: *Deleted

## 2019-12-17 DIAGNOSIS — Z20822 Contact with and (suspected) exposure to covid-19: Secondary | ICD-10-CM | POA: Insufficient documentation

## 2019-12-17 DIAGNOSIS — Z01812 Encounter for preprocedural laboratory examination: Secondary | ICD-10-CM | POA: Diagnosis present

## 2019-12-17 LAB — SARS CORONAVIRUS 2 (TAT 6-24 HRS): SARS Coronavirus 2: NEGATIVE

## 2019-12-17 NOTE — Telephone Encounter (Signed)
Preadmission screen  

## 2019-12-18 ENCOUNTER — Other Ambulatory Visit: Payer: Self-pay | Admitting: Obstetrics and Gynecology

## 2019-12-18 NOTE — H&P (Signed)
Melanie Love is a 34 y.o. female G2P1001 at 57 5/7 weeks (EDD 12/21/19 by LMP c/w 9 week [place])   presenting for IOL with h/o CHTN well-controlled on labetalol until this past week when BP slightly elevated.  No preeclampsia sx and labs WNL except slight elevation of LFT's. Pt advised to proceed with delivery. 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 M, 7lbs, Vaginal Delivery  Past Medical History:  Diagnosis Date  . Dizziness   . 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 H/O hematuria -Proteinuria   . Hypertension    Past Surgical History:  Procedure Laterality Date  . NO PAST SURGERIES     Family History: family history includes Alzheimer's disease in her maternal grandmother; Cancer in her father; Heart attack (age of onset: 38) in her maternal grandmother; Hyperlipidemia in her father; Hypertension in her mother; Thyroid disease in her mother. Social History:  reports that she has never smoked. She has never used smokeless tobacco. She reports previous alcohol use. She reports that she does not use drugs.     Maternal Diabetes: No Genetic Screening: Declined Maternal Ultrasounds/Referrals: Normal Fetal Ultrasounds or other Referrals:  None Maternal Substance Abuse:  No Significant Maternal Medications:  Meds include: Other:  labetalol Significant Maternal Lab Results:  Other: none Other Comments:  None  Review of Systems  Gastrointestinal:  Negative for abdominal pain.   Maternal Medical History:  Contractions: Frequency: irregular.   Perceived severity is mild.    Fetal activity: Perceived fetal activity is normal.    Prenatal complications: CHTN, Carrier of hearing loss  Prenatal Complications - Diabetes: none.      Last menstrual period 03/16/2019. Exam Physical Exam  Prenatal labs: ABO, Rh:  O pos Antibody:  neg Rubella:  immune RPR:   NR HBsAg:   Neg HIV:   NR GBS:   Neg One hour GCT 119  Assessment/Plan: Pt for IOL at term with CHTN and exacerbation of BP with mildly elevated LFT's.  No preeclampsia symptoms.   Will check labs on admission and monitor for any severe range BP or indication for magnesium.  Plan AROM, pitocin and epidural prn    03/18/2019 12/18/2019, 9:50 PM

## 2019-12-18 NOTE — H&P (Deleted)
  The note originally documented on this encounter has been moved the the encounter in which it belongs.  

## 2019-12-19 ENCOUNTER — Inpatient Hospital Stay (HOSPITAL_COMMUNITY): Payer: No Typology Code available for payment source

## 2019-12-19 ENCOUNTER — Other Ambulatory Visit: Payer: Self-pay | Admitting: Obstetrics and Gynecology

## 2019-12-19 ENCOUNTER — Inpatient Hospital Stay (HOSPITAL_COMMUNITY): Payer: No Typology Code available for payment source | Admitting: Anesthesiology

## 2019-12-19 ENCOUNTER — Encounter (HOSPITAL_COMMUNITY): Payer: Self-pay | Admitting: Obstetrics and Gynecology

## 2019-12-19 ENCOUNTER — Inpatient Hospital Stay (HOSPITAL_COMMUNITY)
Admission: AD | Admit: 2019-12-19 | Discharge: 2019-12-21 | DRG: 807 | Disposition: A | Discharge status: Home / Self Care | Payer: No Typology Code available for payment source | Attending: Obstetrics and Gynecology | Admitting: Obstetrics and Gynecology

## 2019-12-19 ENCOUNTER — Other Ambulatory Visit: Payer: Self-pay

## 2019-12-19 DIAGNOSIS — O10913 Unspecified pre-existing hypertension complicating pregnancy, third trimester: Secondary | ICD-10-CM | POA: Diagnosis present

## 2019-12-19 DIAGNOSIS — O1002 Pre-existing essential hypertension complicating childbirth: Secondary | ICD-10-CM | POA: Diagnosis present

## 2019-12-19 DIAGNOSIS — I10 Essential (primary) hypertension: Secondary | ICD-10-CM | POA: Diagnosis present

## 2019-12-19 DIAGNOSIS — Z3A39 39 weeks gestation of pregnancy: Secondary | ICD-10-CM

## 2019-12-19 LAB — COMPREHENSIVE METABOLIC PANEL
ALT: 61 U/L — ABNORMAL HIGH (ref 0–44)
AST: 78 U/L — ABNORMAL HIGH (ref 15–41)
Albumin: 2.8 g/dL — ABNORMAL LOW (ref 3.5–5.0)
Alkaline Phosphatase: 224 U/L — ABNORMAL HIGH (ref 38–126)
Anion gap: 11 (ref 5–15)
BUN: 8 mg/dL (ref 6–20)
CO2: 20 mmol/L — ABNORMAL LOW (ref 22–32)
Calcium: 9.3 mg/dL (ref 8.9–10.3)
Chloride: 106 mmol/L (ref 98–111)
Creatinine, Ser: 0.71 mg/dL (ref 0.44–1.00)
GFR calc Af Amer: >60 mL/min (ref >60)
GFR calc non Af Amer: >60 mL/min (ref >60)
Glucose, Bld: 122 mg/dL — ABNORMAL HIGH (ref 70–99)
Potassium: 3.8 mmol/L (ref 3.5–5.1)
Sodium: 137 mmol/L (ref 135–145)
Total Bilirubin: 0.4 mg/dL (ref 0.3–1.2)
Total Protein: 6 g/dL — ABNORMAL LOW (ref 6.5–8.1)

## 2019-12-19 LAB — CBC
HCT: 35.8 % — ABNORMAL LOW (ref 36.0–46.0)
HCT: 37 % (ref 36.0–46.0)
Hemoglobin: 12 g/dL (ref 12.0–15.0)
Hemoglobin: 12.7 g/dL (ref 12.0–15.0)
MCH: 30.6 pg (ref 26.0–34.0)
MCH: 31.4 pg (ref 26.0–34.0)
MCHC: 33.5 g/dL (ref 30.0–36.0)
MCHC: 34.3 g/dL (ref 30.0–36.0)
MCV: 91.3 fL (ref 80.0–100.0)
MCV: 91.6 fL (ref 80.0–100.0)
Platelets: 148 10*3/uL — ABNORMAL LOW (ref 150–400)
Platelets: 148 10*3/uL — ABNORMAL LOW (ref 150–400)
RBC: 3.92 MIL/uL (ref 3.87–5.11)
RBC: 4.04 MIL/uL (ref 3.87–5.11)
RDW: 13.4 % (ref 11.5–15.5)
RDW: 13.4 % (ref 11.5–15.5)
WBC: 6 10*3/uL (ref 4.0–10.5)
WBC: 9.8 10*3/uL (ref 4.0–10.5)
nRBC: 0 % (ref 0.0–0.2)
nRBC: 0 % (ref 0.0–0.2)

## 2019-12-19 LAB — TYPE AND SCREEN
ABO/RH(D): O POS
Antibody Screen: NEGATIVE

## 2019-12-19 LAB — ABO/RH: ABO/RH(D): O POS

## 2019-12-19 LAB — PROTEIN / CREATININE RATIO, URINE
Creatinine, Urine: 26.2 mg/dL
Total Protein, Urine: <6 mg/dL

## 2019-12-19 LAB — RPR: RPR Ser Ql: NONREACTIVE

## 2019-12-19 MED ORDER — ZOLPIDEM TARTRATE 5 MG PO TABS: 5.0000 mg | ORAL_TABLET | Freq: Every evening | ORAL | Status: DC | PRN

## 2019-12-19 MED ORDER — PHENYLEPHRINE 40 MCG/ML (10ML) SYRINGE FOR IV PUSH (FOR BLOOD PRESSURE SUPPORT): 80.0000 ug | PREFILLED_SYRINGE | INTRAVENOUS | Status: DC | PRN

## 2019-12-19 MED ORDER — SOD CITRATE-CITRIC ACID 500-334 MG/5ML PO SOLN: 30.0000 mL | ORAL | Status: DC | PRN

## 2019-12-19 MED ORDER — SIMETHICONE 80 MG PO CHEW: 80.0000 mg | CHEWABLE_TABLET | ORAL | Status: DC | PRN

## 2019-12-19 MED ORDER — OXYCODONE-ACETAMINOPHEN 5-325 MG PO TABS: 1.0000 | ORAL_TABLET | ORAL | Status: DC | PRN

## 2019-12-19 MED ORDER — ACETAMINOPHEN 325 MG PO TABS: 650.0000 mg | ORAL_TABLET | ORAL | Status: DC | PRN

## 2019-12-19 MED ORDER — LIDOCAINE HCL (PF) 1 % IJ SOLN
INTRAMUSCULAR | Status: DC | PRN
  Administered 2019-12-19: 11 mL via EPIDURAL

## 2019-12-19 MED ORDER — TETANUS-DIPHTH-ACELL PERTUSSIS 5-2.5-18.5 LF-MCG/0.5 IM SUSP: 0.5000 mL | Freq: Once | INTRAMUSCULAR | Status: DC

## 2019-12-19 MED ORDER — LIDOCAINE HCL (PF) 1 % IJ SOLN: 30.0000 mL | INTRAMUSCULAR | Status: DC | PRN

## 2019-12-19 MED ORDER — DIPHENHYDRAMINE HCL 25 MG PO CAPS: 25.0000 mg | ORAL_CAPSULE | Freq: Four times a day (QID) | ORAL | Status: DC | PRN

## 2019-12-19 MED ORDER — OXYTOCIN BOLUS FROM INFUSION
333.0000 mL | Freq: Once | INTRAVENOUS | Status: AC
  Administered 2019-12-19: 333 mL via INTRAVENOUS

## 2019-12-19 MED ORDER — PRENATAL MULTIVITAMIN CH
1.0000 | ORAL_TABLET | Freq: Every day | ORAL | Status: DC
  Administered 2019-12-20 – 2019-12-21 (×2): 1 via ORAL
  Filled 2019-12-19 (×2): qty 1

## 2019-12-19 MED ORDER — ONDANSETRON HCL 4 MG/2ML IJ SOLN: 4.0000 mg | INTRAMUSCULAR | Status: DC | PRN

## 2019-12-19 MED ORDER — LACTATED RINGERS IV SOLN: 500.0000 mL | Freq: Once | INTRAVENOUS | Status: DC

## 2019-12-19 MED ORDER — OXYTOCIN-SODIUM CHLORIDE 30-0.9 UT/500ML-% IV SOLN: 2.5000 [IU]/h | INTRAVENOUS | Status: DC

## 2019-12-19 MED ORDER — EPHEDRINE 5 MG/ML INJ: 10.0000 mg | INTRAVENOUS | Status: DC | PRN

## 2019-12-19 MED ORDER — DIBUCAINE (PERIANAL) 1 % EX OINT: 1.0000 "application " | TOPICAL_OINTMENT | CUTANEOUS | Status: DC | PRN

## 2019-12-19 MED ORDER — ONDANSETRON HCL 4 MG/2ML IJ SOLN
4.0000 mg | Freq: Four times a day (QID) | INTRAMUSCULAR | Status: DC | PRN
  Administered 2019-12-19: 4 mg via INTRAVENOUS
  Filled 2019-12-19: qty 2

## 2019-12-19 MED ORDER — IBUPROFEN 600 MG PO TABS
600.0000 mg | ORAL_TABLET | Freq: Four times a day (QID) | ORAL | Status: DC
  Administered 2019-12-19 – 2019-12-21 (×7): 600 mg via ORAL
  Filled 2019-12-19 (×7): qty 1

## 2019-12-19 MED ORDER — LACTATED RINGERS IV SOLN: 500.0000 mL | INTRAVENOUS | Status: DC | PRN

## 2019-12-19 MED ORDER — FENTANYL-BUPIVACAINE-NACL 0.5-0.125-0.9 MG/250ML-% EP SOLN
EPIDURAL | Status: AC
  Filled 2019-12-19: qty 250

## 2019-12-19 MED ORDER — PROMETHAZINE HCL 25 MG/ML IJ SOLN
12.5000 mg | Freq: Once | INTRAMUSCULAR | Status: AC
  Administered 2019-12-19: 12.5 mg via INTRAVENOUS
  Filled 2019-12-19: qty 1

## 2019-12-19 MED ORDER — LACTATED RINGERS IV SOLN: INTRAVENOUS | Status: DC

## 2019-12-19 MED ORDER — LABETALOL HCL 200 MG PO TABS
200.0000 mg | ORAL_TABLET | Freq: Two times a day (BID) | ORAL | Status: DC
  Administered 2019-12-19: 200 mg via ORAL
  Administered 2019-12-20: 100 mg via ORAL
  Filled 2019-12-19 (×2): qty 1

## 2019-12-19 MED ORDER — TERBUTALINE SULFATE 1 MG/ML IJ SOLN: 0.2500 mg | Freq: Once | INTRAMUSCULAR | Status: DC | PRN

## 2019-12-19 MED ORDER — SODIUM CHLORIDE (PF) 0.9 % IJ SOLN
INTRAMUSCULAR | Status: DC | PRN
  Administered 2019-12-19: 12 mL/h via EPIDURAL

## 2019-12-19 MED ORDER — OXYCODONE-ACETAMINOPHEN 5-325 MG PO TABS: 2.0000 | ORAL_TABLET | ORAL | Status: DC | PRN

## 2019-12-19 MED ORDER — SENNOSIDES-DOCUSATE SODIUM 8.6-50 MG PO TABS
2.0000 | ORAL_TABLET | ORAL | Status: DC
  Administered 2019-12-19 – 2019-12-20 (×2): 2 via ORAL
  Filled 2019-12-19 (×2): qty 2

## 2019-12-19 MED ORDER — OXYTOCIN-SODIUM CHLORIDE 30-0.9 UT/500ML-% IV SOLN
1.0000 m[IU]/min | INTRAVENOUS | Status: DC
  Administered 2019-12-19: 2 m[IU]/min via INTRAVENOUS
  Filled 2019-12-19: qty 500

## 2019-12-19 MED ORDER — FENTANYL-BUPIVACAINE-NACL 0.5-0.125-0.9 MG/250ML-% EP SOLN: 12.0000 mL/h | EPIDURAL | Status: DC | PRN

## 2019-12-19 MED ORDER — BENZOCAINE-MENTHOL 20-0.5 % EX AERO
1.0000 "application " | INHALATION_SPRAY | CUTANEOUS | Status: DC | PRN
  Administered 2019-12-20: 1 via TOPICAL
  Filled 2019-12-19: qty 56

## 2019-12-19 MED ORDER — COCONUT OIL OIL
1.0000 "application " | TOPICAL_OIL | Status: DC | PRN
  Administered 2019-12-20: 1 via TOPICAL

## 2019-12-19 MED ORDER — WITCH HAZEL-GLYCERIN EX PADS: 1.0000 "application " | MEDICATED_PAD | CUTANEOUS | Status: DC | PRN

## 2019-12-19 MED ORDER — BUTORPHANOL TARTRATE 1 MG/ML IJ SOLN: 1.0000 mg | INTRAMUSCULAR | Status: DC | PRN

## 2019-12-19 MED ORDER — DIPHENHYDRAMINE HCL 50 MG/ML IJ SOLN: 12.5000 mg | INTRAMUSCULAR | Status: DC | PRN

## 2019-12-19 MED ORDER — ONDANSETRON HCL 4 MG PO TABS: 4.0000 mg | ORAL_TABLET | ORAL | Status: DC | PRN

## 2019-12-19 NOTE — Progress Notes (Signed)
Patient ID: Melanie Love, female   DOB: [birth year], 33 y.o.   MRN: [id number] Getting increasingly uncomfortable  afeb BP stable  80/3-4/-1 posterior  Increasing pitocin Planning epidural

## 2019-12-19 NOTE — Progress Notes (Signed)
Patient ID: Melanie Love, female   DOB: [birth year], 34 y.o.   MRN: [id number] Pt admitted and started on pitocin  afeb BP stable 119-139/79-80  Cervix posterior, 2cm/60/-2 AROM attempted, scant clear fluid  BP stable.  Labs WNL except LFT's with slight bump to 78/61.  Platelets 148K.  D/w pt if any severe range BP or worsening labs may need magnesium.

## 2019-12-19 NOTE — Anesthesia Procedure Notes (Signed)
Epidural Patient location during procedure: OB Start time: 12/19/2019 3:21 PM End time: 12/19/2019 3:34 PM  Staffing Anesthesiologist: Lowella Curb, MD Performed: anesthesiologist   Preanesthetic Checklist Completed: patient identified, IV checked, site marked, risks and benefits discussed, surgical consent, monitors and equipment checked, pre-op evaluation and timeout performed  Epidural Patient position: sitting Prep: ChloraPrep Patient monitoring: heart rate, cardiac monitor, continuous pulse ox and blood pressure Approach: midline Location: L2-L3 Injection technique: LOR saline  Needle:  Needle type: Tuohy  Needle gauge: 17 G Needle length: 9 cm Needle insertion depth: 5 cm Catheter type: closed end flexible Catheter size: 20 Guage Catheter at skin depth: 9 cm Test dose: negative  Assessment Events: blood not aspirated, injection not painful, no injection resistance, no paresthesia and negative IV test  Additional Notes Reason for block:procedure for pain

## 2019-12-19 NOTE — Anesthesia Preprocedure Evaluation (Signed)
Anesthesia Evaluation  Patient identified by MRN, date of birth, ID band Patient awake    Reviewed: Allergy & Precautions, NPO status , Patient's Chart, lab work & pertinent test results, reviewed documented beta blocker date and time   Airway Mallampati: II  TM Distance: >3 FB Neck ROM: Full    Dental  (+) Teeth Intact, Dental Advisory Given   Pulmonary neg pulmonary ROS,    Pulmonary exam normal breath sounds clear to auscultation       Cardiovascular hypertension, Pt. on medications and Pt. on home beta blockers Normal cardiovascular exam Rhythm:Regular Rate:Normal     Neuro/Psych negative neurological ROS  negative psych ROS   GI/Hepatic negative GI ROS, Neg liver ROS,   Endo/Other  negative endocrine ROS  Renal/GU negative Renal ROS     Musculoskeletal negative musculoskeletal ROS (+)   Abdominal   Peds  Hematology negative hematology ROS (+) Plt 150k   Anesthesia Other Findings Day of surgery medications reviewed with the patient.  Reproductive/Obstetrics (+) Pregnancy                             Anesthesia Physical  Anesthesia Plan  ASA: III  Anesthesia Plan: Epidural   Post-op Pain Management:    Induction:   PONV Risk Score and Plan:   Airway Management Planned:   Additional Equipment:   Intra-op Plan:   Post-operative Plan:   Informed Consent: I have reviewed the patients History and Physical, chart, labs and discussed the procedure including the risks, benefits and alternatives for the proposed anesthesia with the patient or authorized representative who has indicated his/her understanding and acceptance.     Dental advisory given  Plan Discussed with:   Anesthesia Plan Comments: (Patient identified. Risks/Benefits/Options discussed with patient including but not limited to bleeding, infection, nerve damage, paralysis, failed block, incomplete pain control,  headache, blood pressure changes, nausea, vomiting, reactions to medication both or allergic, itching and postpartum back pain. Confirmed with bedside nurse the patient's most recent platelet count. Confirmed with patient that they are not currently taking any anticoagulation, have any bleeding history or any family history of bleeding disorders. Patient expressed understanding and wished to proceed. All questions were answered. )        Anesthesia Quick Evaluation

## 2019-12-20 LAB — CBC
HCT: 35.2 % — ABNORMAL LOW (ref 36.0–46.0)
Hemoglobin: 12.3 g/dL (ref 12.0–15.0)
MCH: 32 pg (ref 26.0–34.0)
MCHC: 34.9 g/dL (ref 30.0–36.0)
MCV: 91.7 fL (ref 80.0–100.0)
Platelets: 154 10*3/uL (ref 150–400)
RBC: 3.84 MIL/uL — ABNORMAL LOW (ref 3.87–5.11)
RDW: 13.5 % (ref 11.5–15.5)
WBC: 11.3 10*3/uL — ABNORMAL HIGH (ref 4.0–10.5)
nRBC: 0 % (ref 0.0–0.2)

## 2019-12-20 LAB — COMPREHENSIVE METABOLIC PANEL
ALT: 61 U/L — ABNORMAL HIGH (ref 0–44)
AST: 88 U/L — ABNORMAL HIGH (ref 15–41)
Albumin: 2.6 g/dL — ABNORMAL LOW (ref 3.5–5.0)
Alkaline Phosphatase: 203 U/L — ABNORMAL HIGH (ref 38–126)
Anion gap: 8 (ref 5–15)
BUN: 8 mg/dL (ref 6–20)
CO2: 24 mmol/L (ref 22–32)
Calcium: 8.8 mg/dL — ABNORMAL LOW (ref 8.9–10.3)
Chloride: 104 mmol/L (ref 98–111)
Creatinine, Ser: 0.77 mg/dL (ref 0.44–1.00)
GFR calc Af Amer: >60 mL/min (ref >60)
GFR calc non Af Amer: >60 mL/min (ref >60)
Glucose, Bld: 83 mg/dL (ref 70–99)
Potassium: 4.4 mmol/L (ref 3.5–5.1)
Sodium: 136 mmol/L (ref 135–145)
Total Bilirubin: 0.7 mg/dL (ref 0.3–1.2)
Total Protein: 5.2 g/dL — ABNORMAL LOW (ref 6.5–8.1)

## 2019-12-20 MED ORDER — LABETALOL HCL 100 MG PO TABS
100.0000 mg | ORAL_TABLET | Freq: Two times a day (BID) | ORAL | Status: DC
  Administered 2019-12-20 – 2019-12-21 (×2): 100 mg via ORAL
  Filled 2019-12-20 (×2): qty 1

## 2019-12-20 NOTE — Anesthesia Postprocedure Evaluation (Signed)
Anesthesia Post Note  Patient: Melanie Love  Procedure(s) Performed: AN AD HOC LABOR EPIDURAL     Patient location during evaluation: Mother Baby Anesthesia Type: Epidural Level of consciousness: awake and alert, oriented and patient cooperative Pain management: pain level controlled Vital Signs Assessment: post-procedure vital signs reviewed and stable Respiratory status: spontaneous breathing Cardiovascular status: stable Postop Assessment: no headache, epidural receding, patient able to bend at knees and no signs of nausea or vomiting Anesthetic complications: no Comments: Pt. States she is walking.  Pain score 2.    No complications documented.  Last Vitals:  Vitals:   12/19/19 2310 12/20/19 0323  BP: 130/83 119/84  Pulse: 79 67  Resp: 18 18  Temp: 36.8 C (!) 36.1 C  SpO2:      Last Pain:  Vitals:   12/20/19 0323  TempSrc: Oral  PainSc:    Pain Goal:                   [facility]

## 2019-12-20 NOTE — Lactation Note (Signed)
This note was copied from a baby's chart. Lactation Consultation Note  Patient Name: Melanie Love Date: 12/20/2019  P2, 7 hour term female infant. Previous hx of  jitteriness, low temps and low blood sugars.  LC entered room mom and infant asleep at this time.  Maternal Data    Feeding Feeding Type: Breast Fed  LATCH Score Latch: Repeated attempts needed to sustain latch, nipple held in mouth throughout feeding, stimulation needed to elicit sucking reflex.  Audible Swallowing: A few with stimulation  Type of Nipple: Flat  Comfort (Breast/Nipple): Soft / non-tender  Hold (Positioning): Full assist, staff holds infant at breast  LATCH Score: 5  Interventions    Lactation Tools Discussed/Used Tools: Nipple Dorris Carnes   Consult Status      Danelle Earthly 12/20/2019, 1:17 AM

## 2019-12-20 NOTE — Lactation Note (Signed)
This note was copied from a baby's chart. Lactation Consultation Note  Patient Name: Melanie Love [id number] Date: 12/20/2019 Reason for consult: Initial assessment;Term P2, 25 hour female term female infant. LC assess infant 's tongue Baby 2 does not have a tongue tie and can extend tongue past gum line without restriction. Per mom, she brought her own personal NS size 16 from home, she used one with her 1st child who had tongue tie.  LC did not see latch with current feeding,  per mom, infant BF 22 minutes prior to [facility] entering the room. LC did breast assessment of  Mom's nipples, mom  is more short shafted than flat, LC notice a ring with an abrasion on mom's nipples. LC informed mom breast can change with each pregnancies, mom agreeable to be LC refitted with 20 mm NS. Mom applied and felt it was a better fit, she will try using it. LC gave mom 24 mm hand pump and suggested mom pre-pump breast prior to latching infant  at next feeding and suggested try to latch infant without the NS. If mom feels she needs use a NS use the size 24 mm instead of 16 mm. Mom knows to ask RN or LC for assistance with latching infant at breast if needed. Mom has been using DEBP and getting about 9 to 10 mls of EBM when pumping. LC assisted mom in supplementing infant with 8 mls of colostrum using a foley cup. Mom will continue to BF infant according to hunger cues, 8 to 12+ times with 24 hours and not exceed 3 hours without BF infant. Mom made aware of O/P services, breastfeeding support groups, community resources, and our phone # for post-discharge questions.     Maternal Data Formula Feeding for Exclusion: No Has patient been taught Hand Expression?: Yes Does the patient have breastfeeding experience prior to this delivery?: Yes  Feeding    LATCH Score                   Interventions Interventions: Breast feeding basics reviewed;Hand express;DEBP;Comfort gels;Skin to skin;Pre-pump if  needed  Lactation Tools Discussed/Used Tools: Comfort gels;Nipple Shields Nipple shield size: 20 (LC re-fitted mom with 54mm, saw abraison and ring on mom's nipple.) WIC Program: No Pump Review: Setup, frequency, and cleaning;Milk Storage Initiated by:: RN Date initiated:: 12/19/19   Consult Status Consult Status: Follow-up Date: 12/21/19 Follow-up type: In-patient    Danelle Earthly 12/20/2019, 7:55 PM

## 2019-12-20 NOTE — Plan of Care (Signed)
  Problem: Education: Goal: Knowledge of condition will improve Outcome: Completed/Met

## 2019-12-20 NOTE — Addendum Note (Signed)
Addendum  created 12/20/19 1945 by Bethena Midget, MD   Intraprocedure Event edited, Intraprocedure Staff edited

## 2019-12-20 NOTE — Progress Notes (Signed)
Post Partum Day 1 Subjective: no complaints, up ad lib and tolerating PO  Objective: Blood pressure 119/84, pulse 67, temperature (!) 97 F (36.1 C), temperature source Oral, resp. rate 18, height 5\' 4"  (1.626 m), weight 72.6 kg, last menstrual period 03/16/2019, SpO2 97 %, unknown if currently breastfeeding.  Physical Exam:  General: alert and cooperative Lochia: appropriate Uterine Fundus: firm   Recent Labs    12/19/19 1905 12/20/19 0348  HGB 12.7 12.3  HCT 37.0 35.2*    Assessment/Plan: Plan for discharge tomorrow  Baby still has not voided so will do circumcision tomorrow BP stable and LFT's stable   LOS: 1 day   12/22/19 12/20/2019, 9:08 AM

## 2019-12-21 LAB — CBC
HCT: 34.5 % — ABNORMAL LOW (ref 36.0–46.0)
Hemoglobin: 11.6 g/dL — ABNORMAL LOW (ref 12.0–15.0)
MCH: 31.4 pg (ref 26.0–34.0)
MCHC: 33.6 g/dL (ref 30.0–36.0)
MCV: 93.5 fL (ref 80.0–100.0)
Platelets: 149 10*3/uL — ABNORMAL LOW (ref 150–400)
RBC: 3.69 MIL/uL — ABNORMAL LOW (ref 3.87–5.11)
RDW: 13.5 % (ref 11.5–15.5)
WBC: 8.5 10*3/uL (ref 4.0–10.5)
nRBC: 0 % (ref 0.0–0.2)

## 2019-12-21 LAB — COMPREHENSIVE METABOLIC PANEL
ALT: 66 U/L — ABNORMAL HIGH (ref 0–44)
AST: 89 U/L — ABNORMAL HIGH (ref 15–41)
Albumin: 2.6 g/dL — ABNORMAL LOW (ref 3.5–5.0)
Alkaline Phosphatase: 180 U/L — ABNORMAL HIGH (ref 38–126)
Anion gap: 9 (ref 5–15)
BUN: 10 mg/dL (ref 6–20)
CO2: 25 mmol/L (ref 22–32)
Calcium: 9 mg/dL (ref 8.9–10.3)
Chloride: 102 mmol/L (ref 98–111)
Creatinine, Ser: 0.72 mg/dL (ref 0.44–1.00)
GFR calc Af Amer: >60 mL/min (ref >60)
GFR calc non Af Amer: >60 mL/min (ref >60)
Glucose, Bld: 80 mg/dL (ref 70–99)
Potassium: 3.9 mmol/L (ref 3.5–5.1)
Sodium: 136 mmol/L (ref 135–145)
Total Bilirubin: 0.4 mg/dL (ref 0.3–1.2)
Total Protein: 5.3 g/dL — ABNORMAL LOW (ref 6.5–8.1)

## 2019-12-21 MED ORDER — ACETAMINOPHEN 325 MG PO TABS: 650.0000 mg | ORAL_TABLET | ORAL | 1 refills | Status: DC | PRN

## 2019-12-21 MED ORDER — IBUPROFEN 600 MG PO TABS: 600.0000 mg | ORAL_TABLET | Freq: Four times a day (QID) | ORAL | 0 refills | Status: DC

## 2019-12-21 NOTE — Lactation Note (Signed)
This note was copied from a baby's chart. Lactation Consultation Note  Patient Name: Melanie Love Date: 12/21/2019 Reason for consult: Follow-up assessment;Term;Infant weight loss;Other (Comment);Nipple pain/trauma  Baby is 49 hours old  As LC entered the room Dr. Chestine Spore checking baby/ post circ.  Baby awake and rooting, moms milk is in bilaterally and she has been using a #20 NS to latch.  Per mom nipples are both sore right > left.  LC recommended prior to latching - breast massage, hand express, prepump to make the nipple and areola more elastic so the NS would fit better. Baby fed for 18 mins , increased swallows with breast compressions. Baby able to soften breast well and was sound asleep  After feeding.  Mom plans to pump off the other breast due to milk being in and very full , but not engorged.  Sore nipple and engorgement prevention and tx reviewed.  LC recommended prior to every latch - breast massage , hand express, pre-pump apply NS and latch.  Use comfort gels after pumping x 6 days and alternate with breast shells while awake.  Use EBM to nipples liberally and follow sore nipple tx until clear.  Mom aware the need for post pumping due to using the Nipple Shield and has been consistently pumping every feeding and her milk is in.  Mom has comfort gels , shells , hand pump and DEBP , NS to take home.  Per mom has a DEBP at home.  LC recommended and offered to place a request for [facility] O/P appt and mom preferred next  Monday . LC placed a request in the Epic basket.  LC praised mom for how well she is doing with breast feeding and pumping.  Mom has the [facility] pamphlet with phone numbers.    Maternal Data Has patient been taught Hand Expression?: Yes  Feeding Feeding Type: Breast Fed  LATCH Score Latch: Grasps breast easily, tongue down, lips flanged, rhythmical sucking.  Audible Swallowing: Spontaneous and intermittent  Type of Nipple: Flat  Comfort  (Breast/Nipple): Filling, red/small blisters or bruises, mild/mod discomfort  Hold (Positioning): No assistance needed to correctly position infant at breast.  LATCH Score: 8  Interventions Interventions: Breast feeding basics reviewed;Assisted with latch;Skin to skin;Breast massage;Hand express;Pre-pump if needed;Breast compression;Adjust position;Support pillows;Position options;Shells;Comfort gels;Hand pump;DEBP  Lactation Tools Discussed/Used Tools: Shells;Pump;Flanges;Comfort gels;Nipple Shields Nipple shield size: 20 Flange Size: 24 Shell Type: Inverted Breast pump type: Manual;Double-Electric Breast Pump WIC Program: No Pump Review: Setup, frequency, and cleaning;Milk Storage Initiated by:: MAI Date initiated:: 12/21/19   Consult Status Consult Status: Follow-up Follow-up type: Out-patient    Melanie Love 12/21/2019, 11:56 AM

## 2019-12-21 NOTE — Progress Notes (Signed)
Post Partum Day 2 Subjective: no complaints, up ad lib and tolerating PO  Objective: Blood pressure 129/76, pulse 78, temperature (!) 97.5 F (36.4 C), temperature source Oral, resp. rate 18, height 5\' 4"  (1.626 m), weight 72.6 kg, last menstrual period 03/16/2019, SpO2 98 %, unknown if currently breastfeeding.  Physical Exam:  General: alert and cooperative Lochia: appropriate Uterine Fundus: firm   Recent Labs    12/20/19 0348 12/21/19 0505  HGB 12.3 11.6*  HCT 35.2* 34.5*    Assessment/Plan: Discharge home  BP stable on 100mg  po labetalol BID, LFT's stable D/w pt I would like her to come in for BP check and repeat labs this Thursday AM 12/25/19, she will also get BP cuff   LOS: 2 days   Thursday 12/21/2019, 10:36 AM

## 2019-12-21 NOTE — Plan of Care (Signed)
  Problem: Education: Goal: Knowledge of General Education information will improve Description: Including pain rating scale, medication(s)/side effects and non-pharmacologic comfort measures Outcome: Completed/Met   Problem: Clinical Measurements: Goal: Ability to maintain clinical measurements within normal limits will improve Outcome: Completed/Met Goal: Will remain free from infection Outcome: Completed/Met   Problem: Activity: Goal: Risk for activity intolerance will decrease Outcome: Completed/Met   Problem: Elimination: Goal: Will not experience complications related to bowel motility Outcome: Completed/Met   Problem: Pain Managment: Goal: General experience of comfort will improve Outcome: Completed/Met   Problem: Skin Integrity: Goal: Risk for impaired skin integrity will decrease Outcome: Completed/Met   Problem: Activity: Goal: Will verbalize the importance of balancing activity with adequate rest periods Outcome: Completed/Met Goal: Ability to tolerate increased activity will improve Outcome: Completed/Met   Problem: Life Cycle: Goal: Chance of risk for complications during the postpartum period will decrease Outcome: Completed/Met   Problem: Role Relationship: Goal: Ability to demonstrate positive interaction with newborn will improve Outcome: Completed/Met   Problem: Skin Integrity: Goal: Demonstration of wound healing without infection will improve Outcome: Completed/Met

## 2019-12-21 NOTE — Plan of Care (Signed)
  Problem: Education: Goal: Knowledge of General Education information will improve Description: Including pain rating scale, medication(s)/side effects and non-pharmacologic comfort measures Outcome: Completed/Met   Problem: Clinical Measurements: Goal: Ability to maintain clinical measurements within normal limits will improve Outcome: Completed/Met Goal: Will remain free from infection Outcome: Completed/Met   Problem: Activity: Goal: Risk for activity intolerance will decrease Outcome: Completed/Met   Problem: Elimination: Goal: Will not experience complications related to bowel motility Outcome: Completed/Met   Problem: Pain Managment: Goal: General experience of comfort will improve Outcome: Completed/Met   Problem: Safety: Goal: Ability to remain free from injury will improve Outcome: Completed/Met   Problem: Skin Integrity: Goal: Risk for impaired skin integrity will decrease Outcome: Completed/Met   Problem: Activity: Goal: Will verbalize the importance of balancing activity with adequate rest periods Outcome: Completed/Met Goal: Ability to tolerate increased activity will improve Outcome: Completed/Met   Problem: Life Cycle: Goal: Chance of risk for complications during the postpartum period will decrease Outcome: Completed/Met   Problem: Role Relationship: Goal: Ability to demonstrate positive interaction with newborn will improve Outcome: Completed/Met   Problem: Skin Integrity: Goal: Demonstration of wound healing without infection will improve Outcome: Completed/Met

## 2019-12-21 NOTE — Discharge Summary (Signed)
Postpartum Discharge Summary       Patient Name: Melanie Love DOB: 1985-12-22 MRN: [id number]  Date of admission: 12/19/2019 Delivery date:12/19/2019  Delivering provider: Huel Cote  Date of discharge: 12/21/2019  Admitting diagnosis: Chronic hypertension with exacerbation during pregnancy in third trimester [O10.913] NSVD (normal spontaneous vaginal delivery) [O80] Intrauterine pregnancy: [redacted]w[redacted]d     Secondary diagnosis:  Active Problems:   Chronic hypertension with exacerbation during pregnancy in third trimester   NSVD (normal spontaneous vaginal delivery)  Additional problems: mildly elevated LFT's    Discharge diagnosis: Term Pregnancy Delivered                                              Post partum procedures:none Augmentation: AROM and Pitocin Complications: None  Hospital course: Induction of Labor With Vaginal Delivery   34 y.o. yo G2P2002 at [redacted]w[redacted]d was admitted to the hospital 12/19/2019 for induction of labor.  Indication for induction: Chronic hypertension.  Patient had an uncomplicated labor course as follows: Membrane Rupture Time/Date: 10:38 AM ,12/19/2019   Delivery Method:Vaginal, Spontaneous  Episiotomy: None  Lacerations:  2nd degree  Details of delivery can be found in separate delivery note.  Patient had a routine postpartum course. Patient is discharged home 12/21/19.  Newborn Data: Birth date:12/19/2019  Birth time:6:17 PM  Gender:Female  Living status:Living  Apgars:8 ,9  Weight:3235 g   Magnesium Sulfate received: No BMZ received: No Rhophylac:No   Physical exam  Vitals:   12/20/19 1325 12/20/19 2027 12/21/19 0539 12/21/19 1015  BP: 127/78 137/88 114/74 129/76  Pulse: 73 66 66 78  Resp: 16 18 18    Temp: 98.1 F (36.7 C) 97.6 F (36.4 C) (!) 97.5 F (36.4 C)   TempSrc: Oral Oral Oral   SpO2: 98%     Weight:      Height:       General: alert and cooperative Lochia: appropriate Uterine Fundus: firm  Labs: Lab Results   Component Value Date   WBC 8.5 12/21/2019   HGB 11.6 (L) 12/21/2019   HCT 34.5 (L) 12/21/2019   MCV 93.5 12/21/2019   PLT 149 (L) 12/21/2019   CMP Latest Ref Rng & Units 12/21/2019  Glucose 70 - 99 mg/dL 80  BUN 6 - 20 mg/dL 10  Creatinine 12/23/2019 - 0.53 mg/dL 9.76  Sodium 7.34 - 193 mmol/L 136  Potassium 3.5 - 5.1 mmol/L 3.9  Chloride 98 - 111 mmol/L 102  CO2 22 - 32 mmol/L 25  Calcium 8.9 - 10.3 mg/dL 9.0  Total Protein 6.5 - 8.1 g/dL 5.3(L)  Total Bilirubin 0.3 - 1.2 mg/dL 0.4  Alkaline Phos 38 - 126 U/L 180(H)  AST 15 - 41 U/L 89(H)  ALT 0 - 44 U/L 66(H)   790 Score: Edinburgh Postnatal Depression Scale Screening Tool 12/20/2019  I have been able to laugh and see the funny side of things. 0  I have looked forward with enjoyment to things. 0  I have blamed myself unnecessarily when things went wrong. 2  I have been anxious or worried for no good reason. 1  I have felt scared or panicky for no good reason. 0  Things have been getting on top of me. 0  I have been so unhappy that I have had difficulty sleeping. 0  I have felt sad or miserable. 0  I have been so  unhappy that I have been crying. 0  The thought of harming myself has occurred to me. 0  Edinburgh Postnatal Depression Scale Total 3     After visit meds:  Allergies as of 12/21/2019   No Known Allergies     Medication List    STOP taking these medications   ASPIRIN 81 PO     TAKE these medications   acetaminophen 325 MG tablet Commonly known as: Tylenol Take 2 tablets (650 mg total) by mouth every 4 (four) hours as needed (for pain scale < 4).   ECHINACEA C COMPLETE PO Take by mouth.   FISH OIL PO Take by mouth.   FOLIC ACID PO Take by mouth.   GARLIC PO Take by mouth.   ibuprofen 600 MG tablet Commonly known as: ADVIL Take 1 tablet (600 mg total) by mouth every 6 (six) hours.   labetalol 200 MG tablet Commonly known as: NORMODYNE TAKE 1 TABLET BY MOUTH TWICE A DAY What changed: how  much to take   PRENATAL VITAMIN PO Take by mouth.   VITAMIN C ER PO Take by mouth.   VITAMIN D PO Take by mouth.        Discharge home in stable condition Infant Feeding: Breast Infant Disposition:home with mother Discharge instruction: per After Visit Summary and Postpartum booklet. Activity: Advance as tolerated. Pelvic rest for 6 weeks.  Diet: routine diet Future Appointments:No future appointments. Follow up Visit:  Follow-up Information    Paula Compton, MD. Schedule an appointment as soon as possible for a visit in 4 day(s).   Specialty: Obstetrics and Gynecology Why: Labs and BP check Contact information: [address] [place] [address] [phone]                Please schedule this patient for a In person postpartum visit in 6 weeks with the following provider: MD. Additional Postpartum F/U:BP check 4 days   Delivery mode:  Vaginal, Spontaneous  Anticipated Birth Control:  Unsure   12/21/2019 Logan Bores, MD

## 2020-03-08 ENCOUNTER — Other Ambulatory Visit: Payer: Self-pay

## 2020-03-08 ENCOUNTER — Encounter: Payer: Self-pay | Admitting: Family Medicine

## 2020-03-08 ENCOUNTER — Ambulatory Visit (INDEPENDENT_AMBULATORY_CARE_PROVIDER_SITE_OTHER): Payer: PRIVATE HEALTH INSURANCE | Admitting: Family Medicine

## 2020-03-08 VITALS — BP 130/90 | HR 86 | Wt 135.8 lb

## 2020-03-08 DIAGNOSIS — I10 Essential (primary) hypertension: Secondary | ICD-10-CM

## 2020-03-08 DIAGNOSIS — Z8639 Personal history of other endocrine, nutritional and metabolic disease: Secondary | ICD-10-CM

## 2020-03-08 DIAGNOSIS — Z87448 Personal history of other diseases of urinary system: Secondary | ICD-10-CM

## 2020-03-08 DIAGNOSIS — Z8349 Family history of other endocrine, nutritional and metabolic diseases: Secondary | ICD-10-CM | POA: Diagnosis not present

## 2020-03-08 DIAGNOSIS — R002 Palpitations: Secondary | ICD-10-CM | POA: Insufficient documentation

## 2020-03-08 DIAGNOSIS — R9431 Abnormal electrocardiogram [ECG] [EKG]: Secondary | ICD-10-CM

## 2020-03-08 NOTE — Progress Notes (Signed)
Subjective:    Patient ID: Melanie Love, female    DOB: 1985/09/13, 34 y.o.   MRN: [id number]  HPI Chief Complaint  Patient presents with   bp elevated    bp elevated 130/80s   She is here with concerns regarding elevated blood pressure and a long history of hypertension.  States she was a preemie and developed hypertension as far back as middle school. States her hypertension became more of an issue recently with pregnancy and delivery of her 97-month-old and was also an issue with her 79-year-old. States she has been on medication since at least 2007.  Recently her blood pressures have been in the 120/80 range at home.  States she would like to find out why she has hypertension and has had an issue with it since her early years. She has a 33 month old.   Reports history of hematuria and protein in her urine.  States she has a "flutter" n her chest that occurs weekly at least.  This is a brief sensation that occurs at random times.  Denies chest pain with exertion, DOE or orthopnea.  No LE edema.  States her liver enzymes were elevated after pregnancy and her OB/gYN kept an eye on this until they were back to normal.    [facility]- Dr. Senaida Ores   No fever, chills, dizziness, abdominal pain, nausea, vomiting, diarrhea or urinary symptoms.  She also complains of a rash around her upper lip which is now also starting on her upper eyelids.  States it is pruritic and burns at times.  She has only tried coconut lotion and uses ice.   Review of Systems Pertinent positives and negatives in the history of present illness.     Objective:   Physical Exam BP 130/90    Pulse 86    Wt 135 lb 12.8 oz (61.6 kg)    LMP 03/16/2019    Breastfeeding Yes    BMI 23.31 kg/m   Alert and in no distress.  Cardiac exam shows a regular sinus rhythm without murmurs or gallops. Lungs are clear to auscultation. Extremities without edema. Skin is warm and dry. Normal and symmetric DTRs.         Assessment & Plan:  Hypertension, unspecified type - Plan: CBC with Differential/Platelet, Comprehensive metabolic panel, EKG 12-Lead, VAS US RENAL ARTERY DUPLEX, Ambulatory referral to Cardiology -I reviewed the office notes from Dr. Graciela Husbands when he evaluated her in 2015.  He recommended a renal ultrasound to look for an underlying explanation for her hypertension.  This was not done per patient.  I will order a renal ultrasound today.  Discussed that her blood pressure is borderline and I recommend she continue on her medication for now.  Limit sodium.-Diet handout provided.  I also recommend that she keep a closer eye on her blood pressure over the next few weeks at home and report back her readings.  Intermittent palpitations - Plan: CBC with Differential/Platelet, Comprehensive metabolic panel, TSH, T4, free, EKG 12-Lead, Ambulatory referral to Cardiology -Fleeting palpitations but otherwise asymptomatic.  I will refer her back to cardiology since she has not seen them since 2015 and she has abnormal findings on her EKG.  Extreme prematurity - Plan: Ambulatory referral to Cardiology  Family history of thyroid disease in mother - Plan: TSH, T4, free -Check thyroid function and follow-up  History of hematuria - Plan: POCT Urinalysis DIP (Proadvantage Device) Was unable to obtain urine result. Will need to get her back for this.  History of hyperlipidemia - Plan: Lipid panel  Abnormal EKG - Plan: Ambulatory referral to Cardiology  EKG shows NSR with sinus arrhythmia and short PR.  Poor R wave progression. Rate 78.  Read by Dr. Susann Givens and myself.  She has seen Dr. Graciela Husbands in the distant past and I will refer her back per patient request and in light of EKG findings.

## 2020-03-08 NOTE — Patient Instructions (Signed)
Dermatology offices  [facility]: Phone #: [phone] Address: [address] [place], [place] [address]  [facility]: Phone: [phone]  Address: [address], [place], [place] [address]  [facility]: [phone] Address: [address] [place], [place] [address]  [facility] Address: [address] [place], [place], [place] [address] Phone: [phone]  KEEP AN EYE ON YOUR BLOOD PRESSURE AT HOME AND FOLLOW UP WITH ME VIRTUALLY OR IN OFFICE IN 2-4 WEEKS.  LIMIT SODIUM TO 2,300 MG DAILY STAY ACTIVE  CALL AND SCHEDULE WITH A DERMATOLOGIST   WE WILL BE IN TOUCH WITH YOUR RESULTS    DASH Eating Plan DASH stands for "Dietary Approaches to Stop Hypertension." The DASH eating plan is a healthy eating plan that has been shown to reduce high blood pressure (hypertension). It may also reduce your risk for type 2 diabetes, heart disease, and stroke. The DASH eating plan may also help with weight loss. What are tips for following this plan?  General guidelines  Avoid eating more than 2,300 mg (milligrams) of salt (sodium) a day. If you have hypertension, you may need to reduce your sodium intake to 1,500 mg a day.  Limit alcohol intake to no more than 1 drink a day for nonpregnant women and 2 drinks a day for men. One drink equals 12 oz of beer, 5 oz of wine, or 1 oz of hard liquor.  Work with your health care provider to maintain a healthy body weight or to lose weight. Ask what an ideal weight is for you.  Get at least 30 minutes of exercise that causes your heart to beat faster (aerobic exercise) most days of the week. Activities may include walking, swimming, or biking.  Work with your health care provider or diet and nutrition specialist (dietitian) to adjust your eating plan to your individual calorie needs. Reading food labels   Check food labels for the amount of sodium per serving. Choose foods with less than 5 percent of  the Daily Value of sodium. Generally, foods with less than 300 mg of sodium per serving fit into this eating plan.  To find whole grains, look for the word "whole" as the first word in the ingredient list. Shopping  Buy products labeled as "low-sodium" or "no salt added."  Buy fresh foods. Avoid canned foods and premade or frozen meals. Cooking  Avoid adding salt when cooking. Use salt-free seasonings or herbs instead of table salt or sea salt. Check with your health care provider or pharmacist before using salt substitutes.  Do not fry foods. Cook foods using healthy methods such as baking, boiling, grilling, and broiling instead.  Cook with heart-healthy oils, such as olive, canola, soybean, or sunflower oil. Meal planning  Eat a balanced diet that includes: ? 5 or more servings of fruits and vegetables each day. At each meal, try to fill half of your plate with fruits and vegetables. ? Up to 6-8 servings of whole grains each day. ? Less than 6 oz of lean meat, poultry, or fish each day. A 3-oz serving of meat is about the same size as a deck of cards. One egg equals 1 oz. ? 2 servings of low-fat dairy each day. ? A serving of nuts, seeds, or beans 5 times each week. ? Heart-healthy fats. Healthy fats called Omega-3 fatty acids are found in foods such as flaxseeds and coldwater fish, like sardines, salmon, and mackerel.  Limit how much you eat of the following: ? Canned or  prepackaged foods. ? Food that is high in trans fat, such as fried foods. ? Food that is high in saturated fat, such as fatty meat. ? Sweets, desserts, sugary drinks, and other foods with added sugar. ? Full-fat dairy products.  Do not salt foods before eating.  Try to eat at least 2 vegetarian meals each week.  Eat more home-cooked food and less restaurant, buffet, and fast food.  When eating at a restaurant, ask that your food be prepared with less salt or no salt, if possible. What foods are  recommended? The items listed may not be a complete list. Talk with your dietitian about what dietary choices are best for you. Grains Whole-grain or whole-wheat bread. Whole-grain or whole-wheat pasta. Brown rice. Orpah Cobb. Bulgur. Whole-grain and low-sodium cereals. Pita bread. Low-fat, low-sodium crackers. Whole-wheat flour tortillas. Vegetables Fresh or frozen vegetables (raw, steamed, roasted, or grilled). Low-sodium or reduced-sodium tomato and vegetable juice. Low-sodium or reduced-sodium tomato sauce and tomato paste. Low-sodium or reduced-sodium canned vegetables. Fruits All fresh, dried, or frozen fruit. Canned fruit in natural juice (without added sugar). Meat and other protein foods Skinless chicken or [place]. Ground chicken or [place]. Pork with fat trimmed off. Fish and seafood. Egg whites. Dried beans, peas, or lentils. Unsalted nuts, nut butters, and seeds. Unsalted canned beans. Lean cuts of beef with fat trimmed off. Low-sodium, lean deli meat. Dairy Low-fat (1%) or fat-free (skim) milk. Fat-free, low-fat, or reduced-fat cheeses. Nonfat, low-sodium ricotta or cottage cheese. Low-fat or nonfat yogurt. Low-fat, low-sodium cheese. Fats and oils Soft margarine without trans fats. Vegetable oil. Low-fat, reduced-fat, or light mayonnaise and salad dressings (reduced-sodium). Canola, safflower, olive, soybean, and sunflower oils. Avocado. Seasoning and other foods Herbs. Spices. Seasoning mixes without salt. Unsalted popcorn and pretzels. Fat-free sweets. What foods are not recommended? The items listed may not be a complete list. Talk with your dietitian about what dietary choices are best for you. Grains Baked goods made with fat, such as croissants, muffins, or some breads. Dry pasta or rice meal packs. Vegetables Creamed or fried vegetables. Vegetables in a cheese sauce. Regular canned vegetables (not low-sodium or reduced-sodium). Regular canned tomato sauce and paste (not  low-sodium or reduced-sodium). Regular tomato and vegetable juice (not low-sodium or reduced-sodium). Rosita Fire. Olives. Fruits Canned fruit in a light or heavy syrup. Fried fruit. Fruit in cream or butter sauce. Meat and other protein foods Fatty cuts of meat. Ribs. Fried meat. Tomasa Blase. Sausage. Bologna and other processed lunch meats. Salami. Fatback. Hotdogs. Bratwurst. Salted nuts and seeds. Canned beans with added salt. Canned or smoked fish. Whole eggs or egg yolks. Chicken or [place] with skin. Dairy Whole or 2% milk, cream, and half-and-half. Whole or full-fat cream cheese. Whole-fat or sweetened yogurt. Full-fat cheese. Nondairy creamers. Whipped toppings. Processed cheese and cheese spreads. Fats and oils Butter. Stick margarine. Lard. Shortening. Ghee. Bacon fat. Tropical oils, such as coconut, palm kernel, or palm oil. Seasoning and other foods Salted popcorn and pretzels. Onion salt, garlic salt, seasoned salt, table salt, and sea salt. Worcestershire sauce. Tartar sauce. Barbecue sauce. Teriyaki sauce. Soy sauce, including reduced-sodium. Steak sauce. Canned and packaged gravies. Fish sauce. Oyster sauce. Cocktail sauce. Horseradish that you find on the shelf. Ketchup. Mustard. Meat flavorings and tenderizers. Bouillon cubes. Hot sauce and Tabasco sauce. Premade or packaged marinades. Premade or packaged taco seasonings. Relishes. Regular salad dressings. Where to find more information:  National Heart, Lung, and Blood Institute: PopSteam.is  American Heart Association: www.heart.org Summary  The DASH  eating plan is a healthy eating plan that has been shown to reduce high blood pressure (hypertension). It may also reduce your risk for type 2 diabetes, heart disease, and stroke.  With the DASH eating plan, you should limit salt (sodium) intake to 2,300 mg a day. If you have hypertension, you may need to reduce your sodium intake to 1,500 mg a day.  When on the DASH eating plan,  aim to eat more fresh fruits and vegetables, whole grains, lean proteins, low-fat dairy, and heart-healthy fats.  Work with your health care provider or diet and nutrition specialist (dietitian) to adjust your eating plan to your individual calorie needs. This information is not intended to replace advice given to you by your health care provider. Make sure you discuss any questions you have with your health care provider. Document Revised: 05/25/2017 Document Reviewed: 06/05/2016 Elsevier Patient Education  2020 [employer].

## 2020-03-09 LAB — LIPID PANEL
Chol/HDL Ratio: 3 ratio (ref 0.0–4.4)
Cholesterol, Total: 247 mg/dL — ABNORMAL HIGH (ref 100–199)
HDL: 83 mg/dL (ref >39)
LDL Chol Calc (NIH): 156 mg/dL — ABNORMAL HIGH (ref 0–99)
Triglycerides: 50 mg/dL (ref 0–149)
VLDL Cholesterol Cal: 8 mg/dL (ref 5–40)

## 2020-03-09 LAB — CBC WITH DIFFERENTIAL/PLATELET
Basophils Absolute: 0 10*3/uL (ref 0.0–0.2)
Basos: 1 %
EOS (ABSOLUTE): 0 10*3/uL (ref 0.0–0.4)
Eos: 1 %
Hematocrit: 46 % (ref 34.0–46.6)
Hemoglobin: 15.8 g/dL (ref 11.1–15.9)
Immature Grans (Abs): 0 10*3/uL (ref 0.0–0.1)
Immature Granulocytes: 0 %
Lymphocytes Absolute: 1.2 10*3/uL (ref 0.7–3.1)
Lymphs: 26 %
MCH: 30.5 pg (ref 26.6–33.0)
MCHC: 34.3 g/dL (ref 31.5–35.7)
MCV: 89 fL (ref 79–97)
Monocytes Absolute: 0.3 10*3/uL (ref 0.1–0.9)
Monocytes: 7 %
Neutrophils Absolute: 3.1 10*3/uL (ref 1.4–7.0)
Neutrophils: 65 %
Platelets: 215 10*3/uL (ref 150–450)
RBC: 5.18 x10E6/uL (ref 3.77–5.28)
RDW: 12.6 % (ref 11.7–15.4)
WBC: 4.8 10*3/uL (ref 3.4–10.8)

## 2020-03-09 LAB — COMPREHENSIVE METABOLIC PANEL
ALT: 30 IU/L (ref 0–32)
AST: 29 IU/L (ref 0–40)
Albumin/Globulin Ratio: 2.1 (ref 1.2–2.2)
Albumin: 4.9 g/dL — ABNORMAL HIGH (ref 3.8–4.8)
Alkaline Phosphatase: 106 IU/L (ref 44–121)
BUN/Creatinine Ratio: 25 — ABNORMAL HIGH (ref 9–23)
BUN: 20 mg/dL (ref 6–20)
Bilirubin Total: 0.5 mg/dL (ref 0.0–1.2)
CO2: 22 mmol/L (ref 20–29)
Calcium: 10.7 mg/dL — ABNORMAL HIGH (ref 8.7–10.2)
Chloride: 101 mmol/L (ref 96–106)
Creatinine, Ser: 0.79 mg/dL (ref 0.57–1.00)
GFR calc Af Amer: 115 mL/min/{1.73_m2} (ref >59)
GFR calc non Af Amer: 99 mL/min/{1.73_m2} (ref >59)
Globulin, Total: 2.3 g/dL (ref 1.5–4.5)
Glucose: 64 mg/dL — ABNORMAL LOW (ref 65–99)
Potassium: 4.3 mmol/L (ref 3.5–5.2)
Sodium: 141 mmol/L (ref 134–144)
Total Protein: 7.2 g/dL (ref 6.0–8.5)

## 2020-03-09 LAB — T4, FREE: Free T4: 1.19 ng/dL (ref 0.82–1.77)

## 2020-03-09 LAB — TSH: TSH: 0.929 u[IU]/mL (ref 0.450–4.500)

## 2020-03-09 NOTE — Progress Notes (Signed)
She needs a recheck her of BMP, vitamin D level and PTH due to hypercalcemia. Please order this and schedule her for a lab visit in 2-3 weeks. Thanks.

## 2020-03-10 ENCOUNTER — Other Ambulatory Visit: Payer: Self-pay | Admitting: Internal Medicine

## 2020-03-17 ENCOUNTER — Ambulatory Visit (INDEPENDENT_AMBULATORY_CARE_PROVIDER_SITE_OTHER): Payer: No Typology Code available for payment source | Admitting: Internal Medicine

## 2020-03-17 ENCOUNTER — Encounter: Payer: Self-pay | Admitting: Internal Medicine

## 2020-03-17 ENCOUNTER — Other Ambulatory Visit: Payer: Self-pay

## 2020-03-17 VITALS — BP 128/82 | HR 68 | Ht 64.0 in | Wt 134.0 lb

## 2020-03-17 DIAGNOSIS — R002 Palpitations: Secondary | ICD-10-CM | POA: Diagnosis not present

## 2020-03-17 DIAGNOSIS — I10 Essential (primary) hypertension: Secondary | ICD-10-CM | POA: Diagnosis not present

## 2020-03-17 DIAGNOSIS — E7849 Other hyperlipidemia: Secondary | ICD-10-CM

## 2020-03-17 NOTE — Patient Instructions (Signed)
Medication Instructions:  NO CHANGES *If you need a refill on your cardiac medications before your next appointment, please call your pharmacy*   Lab Work: TODAY ALDOSTERONE AND RENIN LEVEL WITH RATIO If you have labs (blood work) drawn today and your tests are completely normal, you will receive your results only by: Marland Kitchen MyChart Message (if you have MyChart) OR . A paper copy in the mail If you have any lab test that is abnormal or we need to change your treatment, we will call you to review the results.   Testing/Procedures: NONE   Follow-Up: At [facility], you and your health needs are our priority.  As part of our continuing mission to provide you with exceptional heart care, we have created designated Provider Care Teams.  These Care Teams include your primary Cardiologist (physician) and Advanced Practice Providers (APPs -  Physician Assistants and Nurse Practitioners) who all work together to provide you with the care you need, when you need it.  We recommend signing up for the patient portal called "MyChart".  Sign up information is provided on this After Visit Summary.  MyChart is used to connect with patients for Virtual Visits (Telemedicine).  Patients are able to view lab/test results, encounter notes, upcoming appointments, etc.  Non-urgent messages can be sent to your provider as well.   To learn more about what you can do with MyChart, go to [email or web address].    Your next appointment:   3 year(s)  The format for your next appointment:   In Person  Provider:   Riley Lam, MD   Other Instructions

## 2020-03-17 NOTE — Progress Notes (Signed)
Cardiology Office Note:    Date:  03/17/2020   ID:  Melanie Love, DOB [birth year], MRN [id number]  PCP:  Avanell Shackleton, NP-C  [facility] Cardiologist:  No primary care provider on file.  [facility] Electrophysiologist:  None   Referring MD: Avanell Shackleton, NP-C   CC: HTN Consulted for the evaluation of palpitations at the behest of [place], [place] L, NP-C  History of Present Illness:    Melanie Love is a 34 y.o. female with a hx of extreme prematurity,  HTN (last blood pressure 130/90) since 2012; HLD,  who presents for palpitations.    Patient notes that she had high blood pressure that was first diagnosed in college.  Patient notes that she is unsure why this is the case.  Patient is getting a work up for secondary causes of blood pressure elevation (renal ultrasound pending).  Saw cardiologist at different time with normal EKG and was to have renal US.    Patients feels fine.  Notes that she feels well post partum.  No history of pre-eclampsia, thought had hematuria in high school.  Patient notes her BP is good on the nifedipine (elevated BP on labetalol).  Notes occasional flutter in her chest. Doesn't last long. Notes headaches that started with change in medication.  No chest pain, tightness, stinging, or heaviness.  No syncope, or near syncope.   STOPBANG 0.  Past Medical History:  Diagnosis Date  . Dizziness   . 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 H/O hematuria -Proteinuria   . Hypertension     Past Surgical History:  Procedure Laterality Date  . NO PAST SURGERIES     Current Medications: Current Meds  Medication Sig  . Ascorbic Acid (VITAMIN C ER PO) Take by mouth.  . Multiple Vitamin (MULTIVITAMIN) tablet Take 1 tablet by mouth daily.  Marland Kitchen NIFEdipine (PROCARDIA-XL/NIFEDICAL-XL) 30 MG 24 hr tablet Take 30 mg by mouth daily.  . Omega-3 Fatty Acids (FISH OIL PO) Take by mouth.    Allergies:   Patient has no known allergies.   Social History   Socioeconomic  History  . Marital status: Married    Spouse name: Not on file  . Number of children: Not on file  . Years of education: Not on file  . Highest education level: Not on file  Occupational History  . Not on file  Tobacco Use  . Smoking status: Never Smoker  . Smokeless tobacco: Never Used  Vaping Use  . Vaping Use: Never used  Substance and Sexual Activity  . Alcohol use: Not Currently    Comment: social  . Drug use: No  . Sexual activity: Yes    Birth control/protection: Condom  Other Topics Concern  . Not on file  Social History Narrative  . Not on file   Social Determinants of Health   Financial Resource Strain:   . Difficulty of Paying Living Expenses: Not on file  Food Insecurity:   . Worried About Programme researcher, broadcasting/film/video in the Last Year: Not on file  . Ran Out of Food in the Last Year: Not on file  Transportation Needs:   . Lack of Transportation (Medical): Not on file  . Lack of Transportation (Non-Medical): Not on file  Physical Activity:   . Days of Exercise per Week: Not on file  . Minutes of Exercise per Session: Not on file  Stress:   . Feeling of Stress : Not on file  Social Connections:   .  Frequency of Communication with Friends and Family: Not on file  . Frequency of Social Gatherings with Friends and Family: Not on file  . Attends Religious Services: Not on file  . Active Member of Clubs or Organizations: Not on file  . Attends Banker Meetings: Not on file  . Marital Status: Not on file    Family History: The patient's family history includes Alzheimer's disease in her maternal grandmother; Cancer in her father; Heart attack (age of onset: 33) in her maternal grandmother; Hyperlipidemia in her father; Hypertension in her mother; Thyroid disease in her mother. There is no history of Alcohol abuse, Arthritis, Asthma, Birth defects, COPD, Depression, Diabetes, Drug abuse, Early death, Hearing loss, Heart disease, Kidney disease, Learning  disabilities, Mental illness, Mental retardation, Miscarriages / Stillbirths, Stroke, Vision loss, or Varicose Veins.  ROS:   Please see the history of present illness.    All other systems reviewed and are negative.  EKGs/Labs/Other Studies Reviewed:    The following studies were reviewed today:  EKG:  EKG is  ordered today.  The ekg ordered today demonstrates sinus rhythm, PR interval 88, no evidence of sinus arrrhythmia 03/09/20:  Sinus arrythmia, PR 110, poor r wave progression, V rate ~ 78.    Recent Labs: 03/08/2020: ALT 30; BUN 20; Creatinine, Ser 0.79; Hemoglobin 15.8; Platelets 215; Potassium 4.3; Sodium 141; TSH 0.929  Recent Lipid Panel    Component Value Date/Time   CHOL 247 (H) 03/08/2020 1112   TRIG 50 03/08/2020 1112   HDL 83 03/08/2020 1112   CHOLHDL 3.0 03/08/2020 1112   LDLCALC 156 (H) 03/08/2020 1112   Physical Exam:    VS:  BP 128/82   Pulse 68   Ht 5\' 4"  (1.626 m)   Wt 134 lb (60.8 kg)   SpO2 99%   BMI 23.00 kg/m     Wt Readings from Last 3 Encounters:  03/17/20 134 lb (60.8 kg)  03/08/20 135 lb 12.8 oz (61.6 kg)  12/19/19 160 lb (72.6 kg)    GEN:  Well nourished, well developed in no acute distress HEENT: Normal NECK: No JVD; No carotid bruits LYMPHATICS: No lymphadenopathy CARDIAC: RRR, no murmurs, rubs, gallops RESPIRATORY:  Clear to auscultation without rales, wheezing or rhonchi  ABDOMEN: Soft, non-tender, non-distended MUSCULOSKELETAL:  No edema; No deformity  SKIN: Warm and dry NEUROLOGIC:  Alert and oriented x 3 PSYCHIATRIC:  Normal affect   ASSESSMENT:    1. Essential hypertension   2. Other hyperlipidemia   3. Palpitations    PLAN:    In order of problems listed above:  1. Essential Hypertension in the setting of prematurity - Not suggestive of OSA, pheochromocytoma - will check Aldo/Renin level - continue current medications  HLD; estimated ASCVD 0.5% (age < 38; HTN since 57s) - will monitor  3 year follow up unless  new symptoms or abnormal test results warranting change in plan  Medication Adjustments/Labs and Tests Ordered: Current medicines are reviewed at length with the patient today.  Concerns regarding medicines are outlined above.  Orders Placed This Encounter  Procedures  . Aldosterone + renin activity w/ ratio  . EKG 12-Lead   No orders of the defined types were placed in this encounter.   There are no Patient Instructions on file for this visit.   Signed, 38s, MD  03/17/2020 2:02 PM    [facility]

## 2020-03-18 ENCOUNTER — Ambulatory Visit (HOSPITAL_COMMUNITY)
Admission: RE | Admit: 2020-03-18 | Discharge: 2020-03-18 | Disposition: A | Discharge status: Home / Self Care | Payer: No Typology Code available for payment source | Source: Ambulatory Visit | Attending: Cardiology | Admitting: Cardiology

## 2020-03-18 DIAGNOSIS — I10 Essential (primary) hypertension: Secondary | ICD-10-CM | POA: Diagnosis not present

## 2020-03-18 NOTE — Progress Notes (Signed)
Normal appearing kidneys and renal arteries on ultrasound.

## 2020-03-22 ENCOUNTER — Telehealth: Payer: Self-pay | Admitting: Internal Medicine

## 2020-03-22 LAB — ALDOSTERONE + RENIN ACTIVITY W/ RATIO
ALDOS/RENIN RATIO: 4.6 (ref 0.0–30.0)
ALDOSTERONE: 6.4 ng/dL (ref 0.0–30.0)
Renin: 1.391 ng/mL/hr (ref 0.167–5.380)

## 2020-03-22 NOTE — Telephone Encounter (Signed)
Christell Constant, MD  03/22/2020 10:57 AM EDT     Results: Resuts not suggestive of Hyper-aldo Plan: No change in mgmt  Christell Constant, MD     The patient has been notified of the result and verbalized understanding.  All questions (if any) were answered. Loa Socks, LPN 6/57/8469 6:29 PM

## 2020-03-22 NOTE — Telephone Encounter (Signed)
follow up:     Patient returning a call back for results.

## 2020-03-25 ENCOUNTER — Encounter: Payer: Self-pay | Admitting: Family Medicine

## 2020-03-25 ENCOUNTER — Other Ambulatory Visit: Payer: Self-pay

## 2020-03-25 ENCOUNTER — Ambulatory Visit (INDEPENDENT_AMBULATORY_CARE_PROVIDER_SITE_OTHER): Payer: PRIVATE HEALTH INSURANCE | Admitting: Family Medicine

## 2020-03-25 DIAGNOSIS — I10 Essential (primary) hypertension: Secondary | ICD-10-CM

## 2020-03-25 DIAGNOSIS — E7800 Pure hypercholesterolemia, unspecified: Secondary | ICD-10-CM

## 2020-03-25 NOTE — Progress Notes (Signed)
° °  Subjective:    Patient ID: Melanie Love, female    DOB: Apr 04, 1986, 34 y.o.   MRN: [id number]  HPI Chief Complaint  Patient presents with   follow-up    follow-up 120's/80s   She is here to follow up on abnormal labs. Elevated serum calcium.  She was taking vitamin D but stopped it.   HTN- she saw cardiology. BP now controlled with nifedipine.   She is breastfeeding.   Normal renal US.   No new concerns.    Review of Systems Pertinent positives and negatives in the history of present illness.     Objective:   Physical Exam BP 120/70    Pulse 69    Wt 135 lb 3.2 oz (61.3 kg)    BMI 23.21 kg/m   Alert and oriented in no acute distress.  Not otherwise examined.      Assessment & Plan:  Hypercalcemia - Plan: PTH, Intact and Calcium, Vitamin D, 25-hydroxy, Basic Metabolic Panel  Essential hypertension  Elevated LDL cholesterol level  Blood pressure is now controlled. Discussed abnormal serum calcium.  We will recheck this and also check vitamin D level and PTH.  Follow-up pending results

## 2020-03-26 LAB — BASIC METABOLIC PANEL
BUN/Creatinine Ratio: 18 (ref 9–23)
BUN: 20 mg/dL (ref 6–20)
CO2: 27 mmol/L (ref 20–29)
Calcium: 9.7 mg/dL (ref 8.7–10.2)
Chloride: 101 mmol/L (ref 96–106)
Creatinine, Ser: 1.09 mg/dL — ABNORMAL HIGH (ref 0.57–1.00)
GFR calc Af Amer: 78 mL/min/{1.73_m2} (ref >59)
GFR calc non Af Amer: 67 mL/min/{1.73_m2} (ref >59)
Glucose: 95 mg/dL (ref 65–99)
Potassium: 4.3 mmol/L (ref 3.5–5.2)
Sodium: 139 mmol/L (ref 134–144)

## 2020-03-26 LAB — VITAMIN D 25 HYDROXY (VIT D DEFICIENCY, FRACTURES): Vit D, 25-Hydroxy: 44.9 ng/mL (ref 30.0–100.0)

## 2020-03-26 LAB — PTH, INTACT AND CALCIUM: PTH: 19 pg/mL (ref 15–65)

## 2020-04-14 ENCOUNTER — Encounter: Payer: Self-pay | Admitting: Family Medicine

## 2020-04-15 MED ORDER — NIFEDIPINE ER OSMOTIC RELEASE 30 MG PO TB24
30.0000 mg | ORAL_TABLET | Freq: Every day | ORAL | 1 refills | Status: DC
Start: 2020-04-15 — End: 2020-10-07

## 2020-04-15 NOTE — Telephone Encounter (Signed)
I am ok with this. Is she keeping an eye on her BP at home? If not, then I recommend she check it a couple of times per week and let me know if her readings are not consistently less than 130/80

## 2020-09-07 ENCOUNTER — Telehealth: Payer: Self-pay

## 2020-09-07 NOTE — Telephone Encounter (Signed)
1-please offer appointment with whoever has openings tomorrow for example 2-this can be a common occurrence with nursing mothers.  I recommend having her look up on Google, mastitis>[facility].   This article has some very good recommendations in helping deal with breast pain and inflammation particularly with breast-feeding mothers with a possible clogged duct.  This should give her some things to try today while we anticipate possible appointment tomorrow  I apologize if no openings today , but Vickie had an emergency and I am the only other provider today and we are completely full with appointments today   Link to the article [email or web address]

## 2020-09-07 NOTE — Telephone Encounter (Signed)
Left message for pt and sent mychart message

## 2020-09-07 NOTE — Telephone Encounter (Signed)
Pt states she is breast feeding and has sx of clogged milk duct and wanted to see if she could be seen.  Advised no appts available and Melanie Love not in office.  I asked if she had warmth or redness in breast and she no, just pain & no fever.  Pain since Sunday night.  She is continuing to nurse on that side.  Please call with recommendations.

## 2020-09-08 ENCOUNTER — Ambulatory Visit: Payer: PRIVATE HEALTH INSURANCE | Admitting: Family Medicine

## 2020-09-14 ENCOUNTER — Encounter: Payer: Self-pay | Admitting: Family Medicine

## 2020-10-06 ENCOUNTER — Other Ambulatory Visit: Payer: Self-pay | Admitting: Family Medicine

## 2020-12-15 IMAGING — US US MFM OB DETAIL+14 WK
1 series · 13 of 28 positions shown · non-contrast
Comparison: none

[Series 1: us mfm ob detail+14 wk · 141 acquisitions, 13 frames shown]
[im 6/141]
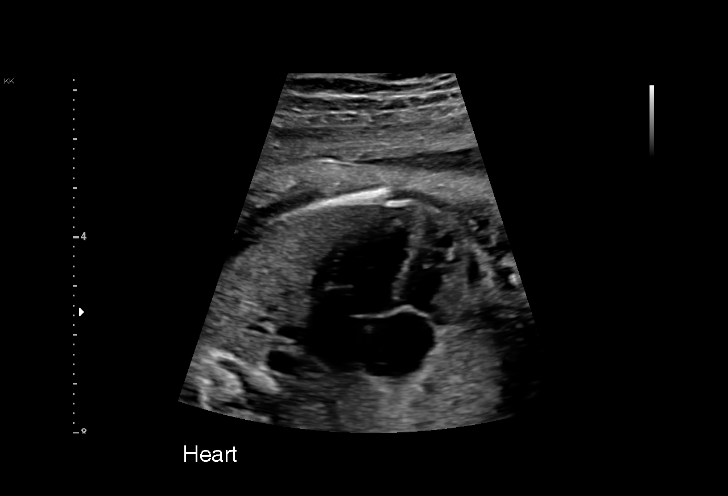
[im 16/141]
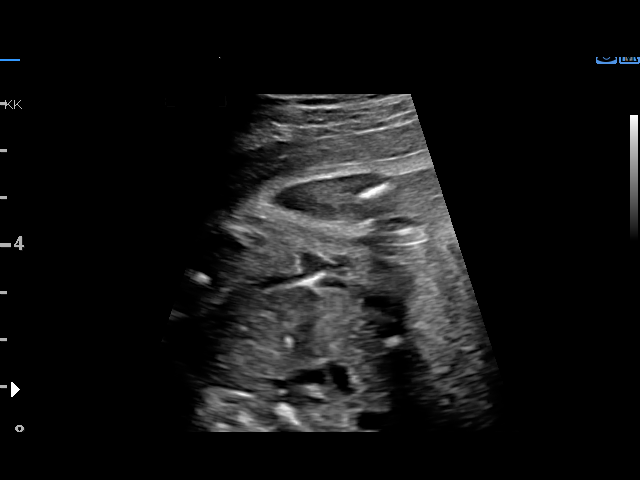
[im 26/141]
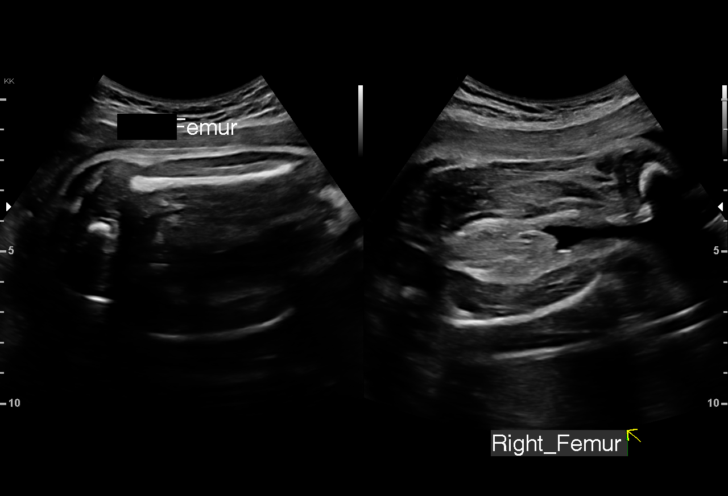
[im 37/141]
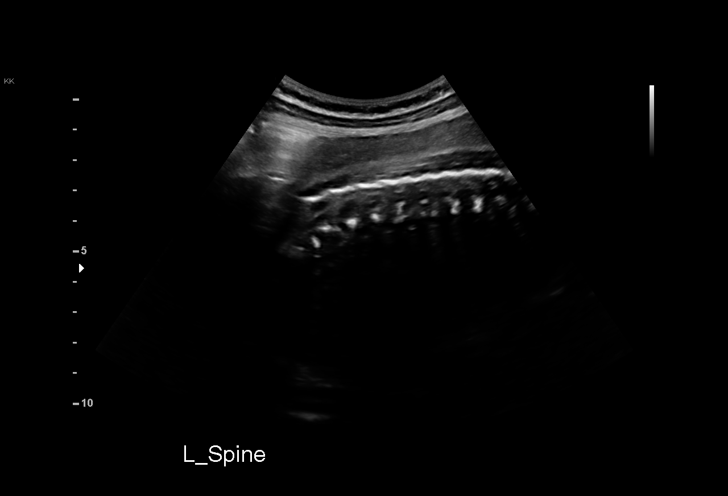
[im 47/141]
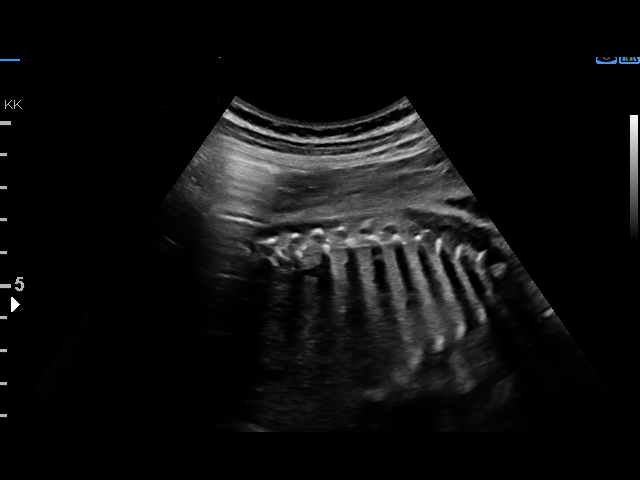
[im 58/141]
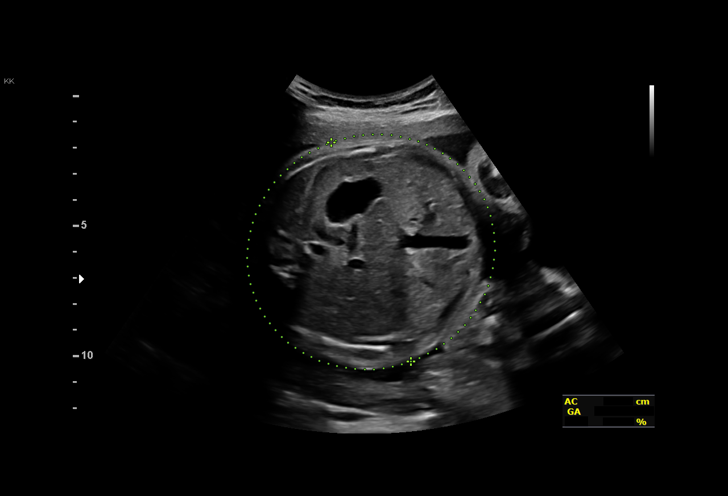
[im 73/141]
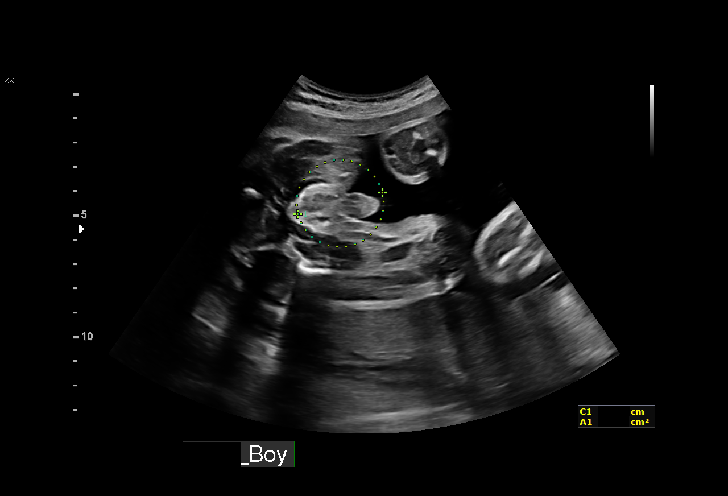
[im 83/141]
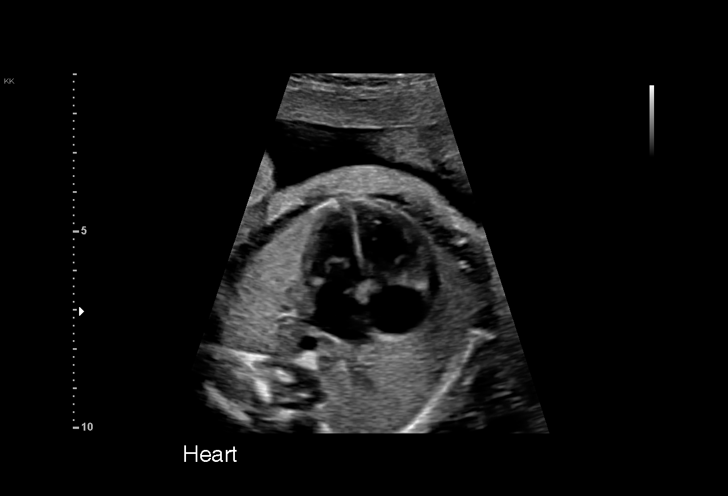
[im 94/141]
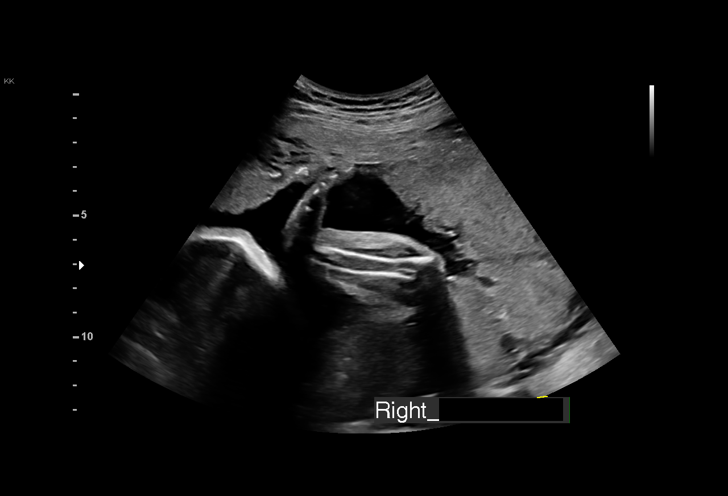
[im 104/141]
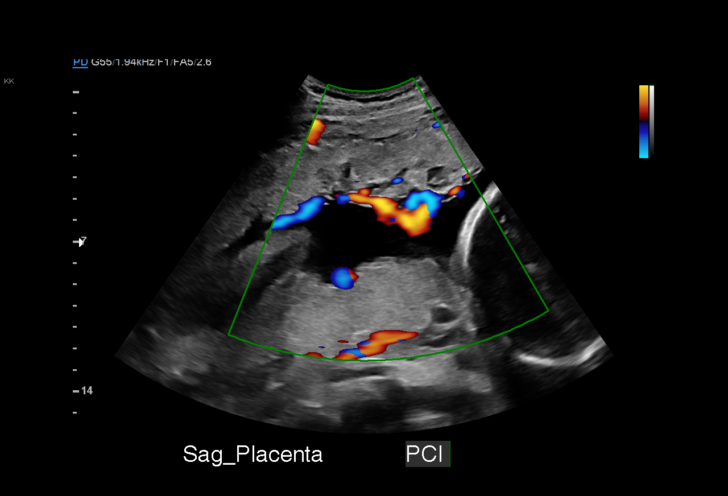
[im 115/141]
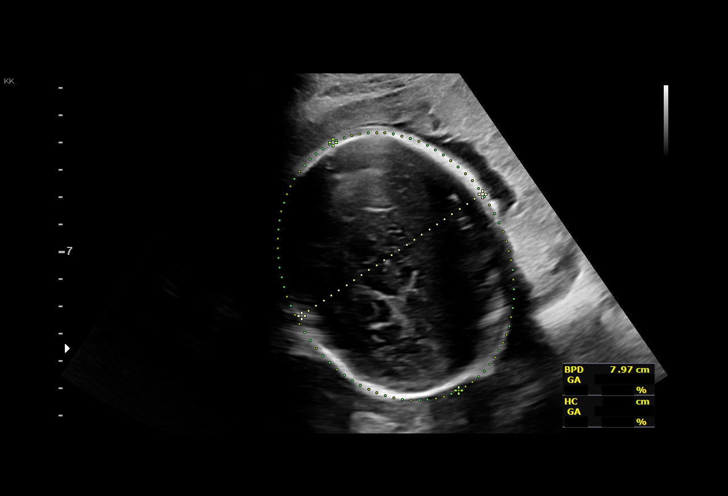
[im 125/141]
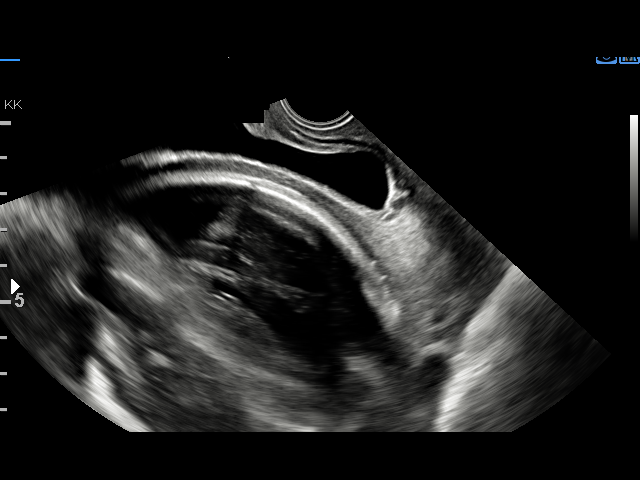
[im 135/141]
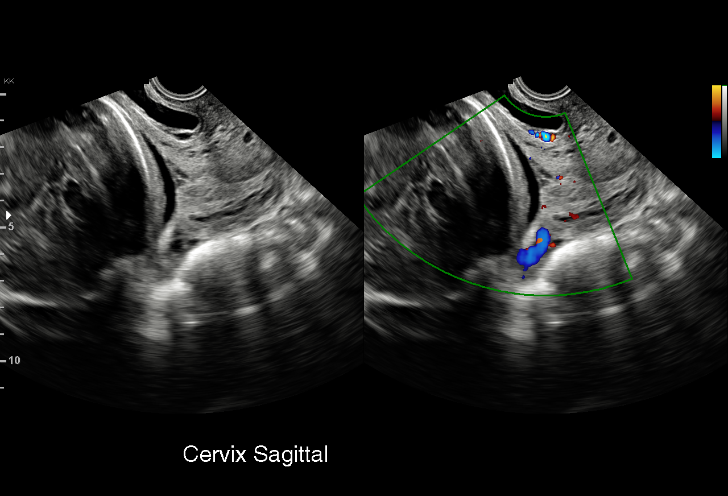

[13 of 28 positions shown; findings below may reference images not displayed]

[HOSPITAL],
                                                            Inc.

  2  US MFM OB TRANSVAGINAL               76817.2      BLANKA BLOCK
 ----------------------------------------------------------------------

 ----------------------------------------------------------------------
Indications

  Placenta previa specified as without
  hemorrhage, third trimester
  31 weeks gestation of pregnancy
  Encounter for antenatal screening for
  malformations
  Hypertension - Chronic/Pre-existing
  (Latetalol)
  Medical complication of pregnancy (Adrenal
  Leukodystroply)
 ----------------------------------------------------------------------
Fetal Evaluation

 Num Of Fetuses:         1
 Fetal Heart Rate(bpm):  138
 Cardiac Activity:       Observed
 Presentation:           Cephalic
 Placenta:               Left lateral
 P. Cord Insertion:      Visualized, central

 Amniotic Fluid
 AFI FV:      Within normal limits

 AFI Sum(cm)     %Tile       Largest Pocket(cm)
 13.97           47

 RUQ(cm)       RLQ(cm)       LUQ(cm)        LLQ(cm)

Biometry

 BPD:      82.3  mm     G. Age:  33w 1d         86  %    CI:        79.96   %    70 - 86
                                                         FL/HC:      18.6   %    19.3 -
 HC:      290.8  mm     G. Age:  32w 0d         29  %    HC/AC:      0.99        0.96 -
 AC:      294.3  mm     G. Age:  33w 3d         93  %    FL/BPD:     65.9   %    71 - 87
 FL:       54.2  mm     G. Age:  28w 5d        < 1  %    FL/AC:      18.4   %    20 - 24
 HUM:      49.2  mm     G. Age:  29w 0d        < 5  %

 Est. FW:    2849  gm      4 lb 1 oz     53  %
OB History

 Gravidity:    2         Term:   1        Prem:   0        SAB:   0
 TOP:          0       Ectopic:  0        Living: 1
Gestational Age

 LMP:           31w 3d        Date:  03/16/19                 EDD:   12/21/19
 U/S Today:     31w 6d                                        EDD:   12/18/19
 Best:          31w 3d     Det. By:  LMP  (03/16/19)          EDD:   12/21/19
Anatomy

 Cranium:               Appears normal         Aortic Arch:            Appears normal
 Cavum:                 Not well visualized    Ductal Arch:            Appears normal
 Ventricles:            Not well visualized    Diaphragm:              Appears normal
 Choroid Plexus:        Appears normal         Stomach:                Appears normal, left
                                                                       sided
 Cerebellum:            Not well visualized    Abdomen:                Appears normal
 Posterior Fossa:       Not well visualized    Abdominal Wall:         Not well visualized
 Nuchal Fold:           Not applicable (>20    Cord Vessels:           Appears normal (3
                        wks GA)                                        vessel cord)
 Face:                  Appears normal         Kidneys:                Appear normal
                        (orbits and profile)
 Lips:                  Appears normal         Bladder:                Appears normal
 Thoracic:              Appears normal         Spine:                  Limited views
                                                                       appear normal
 Heart:                 Appears normal         Upper Extremities:      Appears normal
                        (4CH, axis, and
                        situs)
 RVOT:                  Appears normal         Lower Extremities:      Appears normal
 LVOT:                  Appears normal

 Other:  Male gender. Heels visualized. Technically difficult due to advanced
         gestational age.
Cervix Uterus Adnexa

 Cervix
 Normal appearance by transabdominal scan.
Impression

 Ms. Molden, Jayquan2 P1 at 31w 3d gestation, is here for a second-
 opinion ultrasound to rule out placenta previa and vasa
 previa, which were suspected at your office scan.
 Obstetric history is significant for a term vaginal delivery.
 Her prenatal course has, otherwise, been uneventful.
 On ultrasound, amniotic fluid is normal and good fetal activity
 is seen. Fetal growth is appropriate for gestational age. Fetal
 anatomy appears normal but limited by advanced gestational
 age. Placenta is left lateral and on transabdominal scan, we
 did not suspect placenta previa. However, to rule out vasa
 previa, we performed transvaginal ultrasound. There is no
 evidence of placenta previa or vasa previa.
 I reassured the patient of the findings and that vaginal
 delivery can be safely attempted.
Recommendations

 Follow-up scans as clinically indicated.
                 Tuboi, Nozaki

## 2020-12-29 ENCOUNTER — Other Ambulatory Visit: Payer: Self-pay | Admitting: Family Medicine

## 2021-04-08 ENCOUNTER — Other Ambulatory Visit: Payer: Self-pay | Admitting: Family Medicine

## 2021-06-26 NOTE — L&D Delivery Note (Signed)
Delivery Note Following amniotomy, patient progressed rapidly from 3 to 10 cm.  She pushed well for < 10 minutes. At 6:08 AM a viable female was delivered via Vaginal, Spontaneous (Presentation: Left Occiput Anterior).  APGAR: 9, 9; weight 3560 gm (7lb 13.6oz) .   Placenta status: Spontaneous, Intact.  Cord: 3 vessels with the following complications: None.  Cord pH: n/a  Anesthesia: Local Episiotomy: None Lacerations: 2nd degree Suture Repair: 2.0 vicryl rapide Est. Blood Loss (mL): 178  Mom to postpartum.  Baby to Couplet care / Skin to Skin.  Hobson 03/24/2022, 7:35 AM

## 2021-07-04 ENCOUNTER — Other Ambulatory Visit: Payer: Self-pay | Admitting: Family Medicine

## 2021-07-04 MED ORDER — NIFEDIPINE ER OSMOTIC RELEASE 30 MG PO TB24: 30.0000 mg | ORAL_TABLET | Freq: Every day | ORAL | 1 refills | Status: DC

## 2021-07-04 NOTE — Telephone Encounter (Signed)
Patient was last seen 02/2020 With Vickie, rx pended to last until apt with Orlie Pollen in February

## 2021-07-04 NOTE — Telephone Encounter (Signed)
Pt called and made a appt for her cpe on Feb the 23rd  She needs a refill on her nifedipine to last her until her appt Please send to the [id number] [place], [place] - [address]

## 2021-07-30 ENCOUNTER — Other Ambulatory Visit: Payer: Self-pay | Admitting: Family Medicine

## 2021-08-01 ENCOUNTER — Other Ambulatory Visit: Payer: Self-pay | Admitting: Family Medicine

## 2021-08-19 ENCOUNTER — Encounter: Payer: PRIVATE HEALTH INSURANCE | Admitting: Physician Assistant

## 2021-09-03 ENCOUNTER — Other Ambulatory Visit: Payer: Self-pay | Admitting: Physician Assistant

## 2021-09-05 ENCOUNTER — Other Ambulatory Visit: Payer: Self-pay | Admitting: Physician Assistant

## 2021-09-05 DIAGNOSIS — I10 Essential (primary) hypertension: Secondary | ICD-10-CM

## 2021-09-05 LAB — OB RESULTS CONSOLE ANTIBODY SCREEN: Antibody Screen: NEGATIVE

## 2021-09-05 LAB — OB RESULTS CONSOLE GC/CHLAMYDIA
Chlamydia: NEGATIVE
Neisseria Gonorrhea: NEGATIVE

## 2021-09-05 LAB — HIV ANTIBODY (ROUTINE TESTING W REFLEX): HIV 1&2 Ab, 4th Generation: NEGATIVE

## 2021-09-05 LAB — OB RESULTS CONSOLE GBS
GBS: POSITIVE
GBS: POSITIVE

## 2021-09-05 LAB — OB RESULTS CONSOLE RPR: RPR: NONREACTIVE

## 2021-09-05 LAB — HM PAP SMEAR: HM Pap smear: NEGATIVE

## 2021-09-05 LAB — RESULTS CONSOLE HPV: CHL HPV: NEGATIVE

## 2021-09-05 LAB — OB RESULTS CONSOLE ABO/RH: RH Type: POSITIVE

## 2021-09-05 LAB — OB RESULTS CONSOLE HEPATITIS B SURFACE ANTIGEN: Hepatitis B Surface Ag: NEGATIVE

## 2021-09-05 LAB — OB RESULTS CONSOLE HIV ANTIBODY (ROUTINE TESTING): HIV: NONREACTIVE

## 2021-09-05 LAB — OB RESULTS CONSOLE RUBELLA ANTIBODY, IGM: Rubella: IMMUNE

## 2021-09-05 LAB — HEPATITIS C ANTIBODY: HCV Ab: NEGATIVE

## 2021-09-05 MED ORDER — NIFEDIPINE ER OSMOTIC RELEASE 30 MG PO TB24: 30.0000 mg | ORAL_TABLET | Freq: Every day | ORAL | 0 refills | Status: DC

## 2021-09-05 NOTE — Telephone Encounter (Signed)
Refill sent with a note that it's the last refill, patient needs to schedule an office visit.

## 2021-09-05 NOTE — Telephone Encounter (Signed)
Sent pt a mychart message advising her to call the office and schedule ?

## 2021-12-02 ENCOUNTER — Other Ambulatory Visit: Payer: Self-pay | Admitting: Physician Assistant

## 2021-12-02 ENCOUNTER — Encounter: Payer: Self-pay | Admitting: Physician Assistant

## 2021-12-02 DIAGNOSIS — I10 Essential (primary) hypertension: Secondary | ICD-10-CM

## 2021-12-23 ENCOUNTER — Encounter: Payer: Self-pay | Admitting: Internal Medicine

## 2022-03-01 ENCOUNTER — Encounter: Payer: Self-pay | Admitting: Internal Medicine

## 2022-03-05 ENCOUNTER — Other Ambulatory Visit: Payer: Self-pay | Admitting: Physician Assistant

## 2022-03-05 DIAGNOSIS — I10 Essential (primary) hypertension: Secondary | ICD-10-CM

## 2022-03-13 ENCOUNTER — Encounter (HOSPITAL_COMMUNITY): Payer: Self-pay | Admitting: *Deleted

## 2022-03-13 ENCOUNTER — Telehealth (HOSPITAL_COMMUNITY): Payer: Self-pay | Admitting: *Deleted

## 2022-03-13 NOTE — Telephone Encounter (Signed)
Preadmission screen  

## 2022-03-14 ENCOUNTER — Encounter (HOSPITAL_COMMUNITY): Payer: Self-pay

## 2022-03-15 ENCOUNTER — Encounter (HOSPITAL_COMMUNITY): Payer: Self-pay | Admitting: *Deleted

## 2022-03-15 ENCOUNTER — Telehealth (HOSPITAL_COMMUNITY): Payer: Self-pay | Admitting: *Deleted

## 2022-03-15 NOTE — Telephone Encounter (Signed)
Preadmission screen  

## 2022-03-21 ENCOUNTER — Inpatient Hospital Stay (HOSPITAL_COMMUNITY): Payer: PRIVATE HEALTH INSURANCE

## 2022-03-23 ENCOUNTER — Other Ambulatory Visit: Payer: Self-pay

## 2022-03-23 ENCOUNTER — Encounter (HOSPITAL_COMMUNITY): Payer: Self-pay | Admitting: Obstetrics and Gynecology

## 2022-03-23 ENCOUNTER — Inpatient Hospital Stay (HOSPITAL_COMMUNITY)
Admission: AD | Admit: 2022-03-23 | Discharge: 2022-03-25 | DRG: 807 | Disposition: A | Discharge status: Home / Self Care | Payer: PRIVATE HEALTH INSURANCE | Attending: Obstetrics | Admitting: Obstetrics

## 2022-03-23 DIAGNOSIS — O1002 Pre-existing essential hypertension complicating childbirth: Principal | ICD-10-CM | POA: Diagnosis present

## 2022-03-23 DIAGNOSIS — O10013 Pre-existing essential hypertension complicating pregnancy, third trimester: Principal | ICD-10-CM | POA: Diagnosis present

## 2022-03-23 DIAGNOSIS — Z3A39 39 weeks gestation of pregnancy: Secondary | ICD-10-CM

## 2022-03-23 DIAGNOSIS — O99824 Streptococcus B carrier state complicating childbirth: Secondary | ICD-10-CM | POA: Diagnosis present

## 2022-03-23 DIAGNOSIS — I10 Essential (primary) hypertension: Secondary | ICD-10-CM | POA: Diagnosis present

## 2022-03-23 LAB — CBC
HCT: 35.5 % — ABNORMAL LOW (ref 36.0–46.0)
Hemoglobin: 12.3 g/dL (ref 12.0–15.0)
MCH: 31.3 pg (ref 26.0–34.0)
MCHC: 34.6 g/dL (ref 30.0–36.0)
MCV: 90.3 fL (ref 80.0–100.0)
Platelets: 178 10*3/uL (ref 150–400)
RBC: 3.93 MIL/uL (ref 3.87–5.11)
RDW: 13.8 % (ref 11.5–15.5)
WBC: 7.4 10*3/uL (ref 4.0–10.5)
nRBC: 0 % (ref 0.0–0.2)

## 2022-03-23 MED ORDER — OXYTOCIN-SODIUM CHLORIDE 30-0.9 UT/500ML-% IV SOLN
2.5000 [IU]/h | INTRAVENOUS | Status: DC
  Administered 2022-03-24: 2.5 [IU]/h via INTRAVENOUS

## 2022-03-23 MED ORDER — LACTATED RINGERS IV SOLN: INTRAVENOUS | Status: DC

## 2022-03-23 MED ORDER — OXYTOCIN BOLUS FROM INFUSION
333.0000 mL | Freq: Once | INTRAVENOUS | Status: AC
  Administered 2022-03-24: 333 mL via INTRAVENOUS

## 2022-03-23 MED ORDER — SODIUM CHLORIDE 0.9 % IV SOLN
5.0000 10*6.[IU] | Freq: Once | INTRAVENOUS | Status: AC
  Administered 2022-03-23: 5 10*6.[IU] via INTRAVENOUS
  Filled 2022-03-23: qty 5

## 2022-03-23 MED ORDER — FENTANYL CITRATE (PF) 100 MCG/2ML IJ SOLN: 50.0000 ug | INTRAMUSCULAR | Status: DC | PRN

## 2022-03-23 MED ORDER — OXYCODONE-ACETAMINOPHEN 5-325 MG PO TABS: 2.0000 | ORAL_TABLET | ORAL | Status: DC | PRN

## 2022-03-23 MED ORDER — ACETAMINOPHEN 325 MG PO TABS: 650.0000 mg | ORAL_TABLET | ORAL | Status: DC | PRN

## 2022-03-23 MED ORDER — ONDANSETRON HCL 4 MG/2ML IJ SOLN: 4.0000 mg | Freq: Four times a day (QID) | INTRAMUSCULAR | Status: DC | PRN

## 2022-03-23 MED ORDER — LACTATED RINGERS IV SOLN: 500.0000 mL | INTRAVENOUS | Status: DC | PRN

## 2022-03-23 MED ORDER — SOD CITRATE-CITRIC ACID 500-334 MG/5ML PO SOLN: 30.0000 mL | ORAL | Status: DC | PRN

## 2022-03-23 MED ORDER — NIFEDIPINE ER OSMOTIC RELEASE 30 MG PO TB24
30.0000 mg | ORAL_TABLET | Freq: Every day | ORAL | Status: DC
  Administered 2022-03-24: 30 mg via ORAL
  Filled 2022-03-23 (×2): qty 1

## 2022-03-23 MED ORDER — TERBUTALINE SULFATE 1 MG/ML IJ SOLN: 0.2500 mg | Freq: Once | INTRAMUSCULAR | Status: DC | PRN

## 2022-03-23 MED ORDER — OXYCODONE-ACETAMINOPHEN 5-325 MG PO TABS: 1.0000 | ORAL_TABLET | ORAL | Status: DC | PRN

## 2022-03-23 MED ORDER — PENICILLIN G POT IN DEXTROSE 60000 UNIT/ML IV SOLN
3.0000 10*6.[IU] | INTRAVENOUS | Status: DC
  Administered 2022-03-24: 3 10*6.[IU] via INTRAVENOUS
  Filled 2022-03-23: qty 50

## 2022-03-23 MED ORDER — MISOPROSTOL 25 MCG QUARTER TABLET
25.0000 ug | ORAL_TABLET | ORAL | Status: DC | PRN
  Administered 2022-03-23: 25 ug via VAGINAL
  Filled 2022-03-23: qty 1

## 2022-03-23 MED ORDER — LIDOCAINE HCL (PF) 1 % IJ SOLN
30.0000 mL | INTRAMUSCULAR | Status: AC | PRN
  Administered 2022-03-24: 30 mL via SUBCUTANEOUS
  Filled 2022-03-23: qty 30

## 2022-03-23 NOTE — H&P (Signed)
36 y.o. K2H0623 @ [redacted]w[redacted]d presents for induction of labor for chronic hypertension at term.  Otherwise has good fetal movement and no bleeding.  Pregnancy complicated: Chronic hypertension: well controlled on procardia Xl 30mg  daily.  On aspirin this pregnancy.  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    H/O hematuria -Proteinuria    History of postpartum gestational hypertension    Hypertension    PONV (postoperative nausea and vomiting)     Past Surgical History:  Procedure Laterality Date   DENTAL SURGERY     NO PAST SURGERIES      OB History  Gravida Para Term Preterm AB Living  3 2 2     2   SAB IAB Ectopic Multiple Live Births        0 2    # Outcome Date GA Lbr Len/2nd Weight Sex Delivery Anes PTL Lv  3 Current           2 Term 12/19/19 [redacted]w[redacted]d 02:05 / 00:17 3235 g M Vag-Spont EPI  LIV  1 Term 09/22/15 [redacted]w[redacted]d 05:39 / 02:55 3175 g M Vag-Spont EPI  LIV    Social History   Socioeconomic History   Marital status: Married    Spouse name: Not on file   Number of children: Not on file   Years of education: Not on file   Highest education level: Not on file  Occupational History   Not on file  Tobacco Use   Smoking status: Never   Smokeless tobacco: Never  Vaping Use   Vaping Use: Never used  Substance and Sexual Activity   Alcohol use: Not Currently    Comment: social   Drug use: No   Sexual activity: Yes    Birth control/protection: Condom  Other Topics Concern   Not on file  Social History Narrative   Not on file   Social Determinants of Health   Financial Resource Strain: Not on file  Food Insecurity: No Food Insecurity (03/23/2022)   Hunger Vital Sign    Worried About Running Out of  Food in the Last Year: Never true    Ran Out of Food in the Last Year: Never true  Transportation Needs: No Transportation Needs (03/23/2022)   PRAPARE - Hydrologist (Medical): No    Lack of Transportation (Non-Medical): No  Physical Activity: Not on file  Stress: Not on file  Social Connections: Not on file  Intimate Partner Violence: Not At Risk (03/23/2022)   Humiliation, Afraid, Rape, and Kick questionnaire    Fear of Current or Ex-Partner: No    Emotionally Abused: No    Physically Abused: No    Sexually Abused: No   Patient has no known allergies.    Prenatal Transfer Tool  Maternal Diabetes: No Genetic Screening: Declined Maternal Ultrasounds/Referrals: Isolated EIF (echogenic intracardiac focus) Fetal Ultrasounds or other Referrals:  None Maternal Substance Abuse:  No Significant Maternal Medications:  Meds include: Other:  procardia XL Significant Maternal Lab Results: Group B Strep positive  ABO, Rh: O/Positive/-- (03/13 0000) Antibody: Negative (03/13 0000) Rubella: Immune (03/13 0000) RPR: Nonreactive (03/13 0000)  HBsAg: Negative (03/13 0000)  HIV: Non-reactive, negative (03/13 0000)  GBS: Positive, Positive/-- (  03/13 0000)        Vitals:   03/23/22 2212 03/23/22 2222  BP: (!) 149/84 (!) 149/94  Pulse: 67 67  Resp: 18   Temp: 97.7 F (36.5 C)      General:  NAD Abdomen:  soft, gravid, EFW 6.5# Ex:  1+ edema SVE:  1/50/-3/posterior FHTs:  120s, moderate variability, category 1 Toco:  irregular  Vertex position confirmed by limited bedside ultrasound  A/P   36 y.o. [id number] [redacted]w[redacted]d presents for induction of labor for [facility] Cervix unfavorable.  Discussed option for foley balloon--cervix very posterior and difficult check.  She would like to start with misoprostol alone first.  Will re-evaluate in 4 hours possibility for FB placement CHTN:  Bps mild range, asymptomatic.  Continue home procardia XL 30mg  daily, check CMP  now GBS positive: pcn   Morrell Fluke GEFFEL Janet Humphreys

## 2022-03-24 ENCOUNTER — Encounter (HOSPITAL_COMMUNITY): Payer: Self-pay | Admitting: Obstetrics

## 2022-03-24 LAB — TYPE AND SCREEN
ABO/RH(D): O POS
Antibody Screen: NEGATIVE

## 2022-03-24 LAB — COMPREHENSIVE METABOLIC PANEL
ALT: 40 U/L (ref 0–44)
AST: 45 U/L — ABNORMAL HIGH (ref 15–41)
Albumin: 2.7 g/dL — ABNORMAL LOW (ref 3.5–5.0)
Alkaline Phosphatase: 153 U/L — ABNORMAL HIGH (ref 38–126)
Anion gap: 9 (ref 5–15)
BUN: 12 mg/dL (ref 6–20)
CO2: 19 mmol/L — ABNORMAL LOW (ref 22–32)
Calcium: 9.3 mg/dL (ref 8.9–10.3)
Chloride: 109 mmol/L (ref 98–111)
Creatinine, Ser: 0.68 mg/dL (ref 0.44–1.00)
GFR, Estimated: >60 mL/min (ref >60)
Glucose, Bld: 82 mg/dL (ref 70–99)
Potassium: 4.1 mmol/L (ref 3.5–5.1)
Sodium: 137 mmol/L (ref 135–145)
Total Bilirubin: 0.6 mg/dL (ref 0.3–1.2)
Total Protein: 5.7 g/dL — ABNORMAL LOW (ref 6.5–8.1)

## 2022-03-24 LAB — RPR: RPR Ser Ql: NONREACTIVE

## 2022-03-24 MED ORDER — SIMETHICONE 80 MG PO CHEW: 80.0000 mg | CHEWABLE_TABLET | ORAL | Status: DC | PRN

## 2022-03-24 MED ORDER — COCONUT OIL OIL
1.0000 | TOPICAL_OIL | Status: DC | PRN
  Administered 2022-03-24: 1 via TOPICAL

## 2022-03-24 MED ORDER — TERBUTALINE SULFATE 1 MG/ML IJ SOLN: 0.2500 mg | Freq: Once | INTRAMUSCULAR | Status: DC | PRN

## 2022-03-24 MED ORDER — DIBUCAINE (PERIANAL) 1 % EX OINT: 1.0000 | TOPICAL_OINTMENT | CUTANEOUS | Status: DC | PRN

## 2022-03-24 MED ORDER — OXYTOCIN-SODIUM CHLORIDE 30-0.9 UT/500ML-% IV SOLN
1.0000 m[IU]/min | INTRAVENOUS | Status: DC
  Administered 2022-03-24: 2 m[IU]/min via INTRAVENOUS
  Filled 2022-03-24: qty 500

## 2022-03-24 MED ORDER — IBUPROFEN 600 MG PO TABS
600.0000 mg | ORAL_TABLET | Freq: Four times a day (QID) | ORAL | Status: DC
  Administered 2022-03-24 – 2022-03-25 (×4): 600 mg via ORAL
  Filled 2022-03-24 (×5): qty 1

## 2022-03-24 MED ORDER — DIPHENHYDRAMINE HCL 25 MG PO CAPS: 25.0000 mg | ORAL_CAPSULE | Freq: Four times a day (QID) | ORAL | Status: DC | PRN

## 2022-03-24 MED ORDER — BENZOCAINE-MENTHOL 20-0.5 % EX AERO
1.0000 | INHALATION_SPRAY | CUTANEOUS | Status: DC | PRN
  Administered 2022-03-24: 1 via TOPICAL
  Filled 2022-03-24: qty 56

## 2022-03-24 MED ORDER — TETANUS-DIPHTH-ACELL PERTUSSIS 5-2.5-18.5 LF-MCG/0.5 IM SUSY: 0.5000 mL | PREFILLED_SYRINGE | Freq: Once | INTRAMUSCULAR | Status: DC

## 2022-03-24 MED ORDER — WITCH HAZEL-GLYCERIN EX PADS: 1.0000 | MEDICATED_PAD | CUTANEOUS | Status: DC | PRN

## 2022-03-24 MED ORDER — SENNOSIDES-DOCUSATE SODIUM 8.6-50 MG PO TABS
2.0000 | ORAL_TABLET | ORAL | Status: DC
  Administered 2022-03-24 – 2022-03-25 (×2): 2 via ORAL
  Filled 2022-03-24 (×2): qty 2

## 2022-03-24 MED ORDER — ONDANSETRON HCL 4 MG/2ML IJ SOLN: 4.0000 mg | INTRAMUSCULAR | Status: DC | PRN

## 2022-03-24 MED ORDER — ACETAMINOPHEN 325 MG PO TABS
650.0000 mg | ORAL_TABLET | ORAL | Status: DC | PRN
  Administered 2022-03-24: 650 mg via ORAL
  Filled 2022-03-24: qty 2

## 2022-03-24 MED ORDER — PRENATAL MULTIVITAMIN CH
1.0000 | ORAL_TABLET | Freq: Every day | ORAL | Status: DC
  Administered 2022-03-24 – 2022-03-25 (×2): 1 via ORAL
  Filled 2022-03-24 (×2): qty 1

## 2022-03-24 MED ORDER — OXYCODONE HCL 5 MG PO TABS: 10.0000 mg | ORAL_TABLET | ORAL | Status: DC | PRN

## 2022-03-24 MED ORDER — OXYCODONE HCL 5 MG PO TABS: 5.0000 mg | ORAL_TABLET | ORAL | Status: DC | PRN

## 2022-03-24 MED ORDER — ONDANSETRON HCL 4 MG PO TABS: 4.0000 mg | ORAL_TABLET | ORAL | Status: DC | PRN

## 2022-03-24 NOTE — Lactation Note (Signed)
This note was copied from a baby's chart. Lactation Consultation Note  Patient Name: Melanie Love [id number] Date: 03/24/2022 Reason for consult: Follow-up assessment Age:34 hours  P3, Experienced w/ breastfeeding.  Mother states she used a nipple shield with her first two children.  "[name]" latched without nipple shield. Her son had a frenectomy and after breastfeeding improved. Observed mother hand express and latch baby with ease. Intermittent swallows observed. Mom made aware of O/P services, breastfeeding support groupand our phone # for post-discharge questions.    Maternal Data Does the patient have breastfeeding experience prior to this delivery?: Yes How long did the patient breastfeed?: 6 mos & one year  Feeding Mother's Current Feeding Choice: Breast Milk  LATCH Score Latch: Grasps breast easily, tongue down, lips flanged, rhythmical sucking.  Audible Swallowing: A few with stimulation  Type of Nipple: Everted at rest and after stimulation  Comfort (Breast/Nipple): Soft / non-tender  Hold (Positioning): No assistance needed to correctly position infant at breast.  LATCH Score: 9   Lactation Tools Discussed/Used    Interventions Interventions: Breast feeding basics reviewed;Skin to skin;Hand express;Education  Discharge Pump: Hands Free;Personal ([place]oz[place])  Consult Status Consult Status: Follow-up Date: 03/25/22 Follow-up type: In-patient    [name] [facility] 03/24/2022, 10:21 AM

## 2022-03-24 NOTE — Lactation Note (Signed)
This note was copied from a baby's chart. Lactation Consultation Note  Patient Name: Melanie Love [id number] Date: 03/24/2022 Reason for consult: Initial assessment Age:34 hours  P3, Mother sleeping.  Spoke with father who states mother is experienced 6 mos.with first child and one year with second child.  Father states baby latched for approx 15 min in L&D.  Suggest to parent to call when mother or baby wakes to feed. Provided lactation info. sheet.   Maternal Data Does the patient have breastfeeding experience prior to this delivery?: Yes How long did the patient breastfeed?: 6 mos & one year  Feeding Mother's Current Feeding Choice: Breast Milk  Interventions Interventions: [employer] brochure   Consult Status Consult Status: Follow-up Date: 03/24/22 Follow-up type: In-patient    [name] [facility] 03/24/2022, 9:17 AM

## 2022-03-24 NOTE — Progress Notes (Signed)
Increased cramping with one dose of misoprostol  BP 129/84   Pulse 90   Temp 98.1 F (36.7 C) (Oral)   Resp 16   Ht 5\' 4"  (1.626 m)   Wt 71.7 kg   BMI 27.12 kg/m   Toco: q2-3 minutes EFM: 150s, moderate variability, category 1 SVE: 3/50/-2/posterior, AROM clear fluid  A/P:  G3P2 @ [redacted]w[redacted]d with IOL for CHTN Will start pitocin now GBS positive Anticipate SVD

## 2022-03-25 LAB — CBC
HCT: 31.2 % — ABNORMAL LOW (ref 36.0–46.0)
Hemoglobin: 10.8 g/dL — ABNORMAL LOW (ref 12.0–15.0)
MCH: 31.4 pg (ref 26.0–34.0)
MCHC: 34.6 g/dL (ref 30.0–36.0)
MCV: 90.7 fL (ref 80.0–100.0)
Platelets: 148 10*3/uL — ABNORMAL LOW (ref 150–400)
RBC: 3.44 MIL/uL — ABNORMAL LOW (ref 3.87–5.11)
RDW: 14 % (ref 11.5–15.5)
WBC: 7.4 10*3/uL (ref 4.0–10.5)
nRBC: 0 % (ref 0.0–0.2)

## 2022-03-25 MED ORDER — IBUPROFEN 600 MG PO TABS: 600.0000 mg | ORAL_TABLET | Freq: Four times a day (QID) | ORAL | 0 refills | Status: DC

## 2022-03-25 MED ORDER — ACETAMINOPHEN 325 MG PO TABS: 650.0000 mg | ORAL_TABLET | ORAL | 1 refills | Status: DC | PRN

## 2022-03-25 NOTE — Progress Notes (Signed)
Post Partum Day 1 Subjective: no complaints, up ad lib, and tolerating PO  Breastfeeding going well.  Objective: Blood pressure 123/81, pulse 67, temperature 98 F (36.7 C), temperature source Oral, resp. rate 17, height 5\' 4"  (1.626 m), weight 71.7 kg, SpO2 99 %, unknown if currently breastfeeding.  Physical Exam:  General: alert and cooperative Lochia: appropriate Uterine Fundus: firm   Recent Labs    03/23/22 2330 03/25/22 0437  HGB 12.3 10.8*  HCT 35.5* 31.2*    Assessment/Plan: Discharge home BP controlled on procardia XL 30mg , will plan to continue BP check in office in 1-2 weeks   LOS: 2 days   Logan Bores, MD 03/25/2022, 7:34 AM

## 2022-03-25 NOTE — Discharge Summary (Signed)
Postpartum Discharge Summary       Patient Name: Melanie Love DOB: 10-04-1985 MRN: [id number]  Date of admission: 03/23/2022 Delivery date:03/24/2022  Delivering provider: Marlow Baars  Date of discharge: 03/25/2022  Admitting diagnosis: Benign essential hypertension antepartum in third trimester [O10.013] Intrauterine pregnancy: [redacted]w[redacted]d     Secondary diagnosis:  Principal Problem:   Benign essential hypertension antepartum in third trimester  Additional problems: none    Discharge diagnosis: Term Pregnancy Delivered and CHTN                                              Post partum procedures: none Augmentation: AROM and Cytotec Complications: None  Hospital course: Induction of Labor With Vaginal Delivery   36 y.o. yo G3P3003 at [redacted]w[redacted]d was admitted to the hospital 03/23/2022 for induction of labor.  Indication for induction:  CHTN .  Patient had an uncomplicated labor course as follows: Membrane Rupture Time/Date: 4:00 AM ,03/24/2022   Delivery Method:Vaginal, Spontaneous  Episiotomy: None  Lacerations:  2nd degree  Details of delivery can be found in separate delivery note.  Patient had a routine postpartum course. Patient is discharged home 03/25/22.  Newborn Data: Birth date:03/24/2022  Birth time:6:08 AM  Gender:Female  Living status:Living  Apgars:9 ,9  Weight:3560 g   Magnesium Sulfate received: No BMZ received: No  Transfusion:No  Physical exam  Vitals:   03/24/22 1806 03/24/22 2011 03/24/22 2204 03/25/22 0508  BP: 126/84 138/86 138/82 123/81  Pulse: 60 67 65 67  Resp: 16 17 17 17   Temp: 98.3 F (36.8 C)  97.8 F (36.6 C) 98 F (36.7 C)  TempSrc: Oral  Oral Oral  SpO2: 98%  98% 99%  Weight:      Height:       General: alert and cooperative Lochia: appropriate Uterine Fundus: firm I Labs: Lab Results  Component Value Date   WBC 7.4 03/25/2022   HGB 10.8 (L) 03/25/2022   HCT 31.2 (L) 03/25/2022   MCV 90.7 03/25/2022   PLT 148 (L) 03/25/2022       Latest Ref Rng & Units 03/23/2022   11:30 PM  CMP  Glucose 70 - 99 mg/dL 82   BUN 6 - 20 mg/dL 12   Creatinine 03/25/2022 - 1.00 mg/dL 7.35   Sodium 3.29 - 924 mmol/L 137   Potassium 3.5 - 5.1 mmol/L 4.1   Chloride 98 - 111 mmol/L 109   CO2 22 - 32 mmol/L 19   Calcium 8.9 - 10.3 mg/dL 9.3   Total Protein 6.5 - 8.1 g/dL 5.7   Total Bilirubin 0.3 - 1.2 mg/dL 0.6   Alkaline Phos 38 - 126 U/L 153   AST 15 - 41 U/L 45   ALT 0 - 44 U/L 40    Edinburgh Score:    03/24/2022    7:33 PM  Edinburgh Postnatal Depression Scale Screening Tool  I have been able to laugh and see the funny side of things. 0  I have looked forward with enjoyment to things. 0  I have blamed myself unnecessarily when things went wrong. 1  I have been anxious or worried for no good reason. 0  I have felt scared or panicky for no good reason. 1  Things have been getting on top of me. 0  I have been so unhappy that I have had difficulty sleeping.  0  I have felt sad or miserable. 0  I have been so unhappy that I have been crying. 0  The thought of harming myself has occurred to me. 0  Edinburgh Postnatal Depression Scale Total 2     After visit meds:  Allergies as of 03/25/2022   No Known Allergies      Medication List     STOP taking these medications    FOLIC ACID PO       TAKE these medications    acetaminophen 325 MG tablet Commonly known as: Tylenol Take 2 tablets (650 mg total) by mouth every 4 (four) hours as needed (for pain scale < 4).   FISH OIL PO Take 1 capsule by mouth daily.   ibuprofen 600 MG tablet Commonly known as: ADVIL Take 1 tablet (600 mg total) by mouth every 6 (six) hours.   multivitamin tablet Take 1 tablet by mouth daily.   NIFEdipine 30 MG 24 hr tablet Commonly known as: PROCARDIA-XL/NIFEDICAL-XL TAKE 1 TABLET BY MOUTH EVERY DAY         Discharge home in stable condition Infant Feeding: Breast Infant Disposition:home with mother Discharge instruction:  per After Visit Summary and Postpartum booklet. Activity: Advance as tolerated. Pelvic rest for 6 weeks.  Diet: routine diet Future Appointments:No future appointments. Follow up Visit:  Follow-up Information     Paula Compton, MD. Schedule an appointment as soon as possible for a visit in 1 week(s).   Specialty: Obstetrics and Gynecology Why: Call for BP check appointment in 7-10 days Contact information: [place] [address] [phone]                  Please schedule this patient for a In person postpartum visit in 1 week with the following provider: MD.  Delivery mode:  Vaginal, Spontaneous  Anticipated Birth Control:  Condoms   03/25/2022 Logan Bores, MD

## 2022-03-25 NOTE — Lactation Note (Signed)
This note was copied from a baby's chart. Lactation Consultation Note  Patient Name: Melanie Love [id number] Date: 03/25/2022 Reason for consult: Follow-up assessment;Mother's request;Difficult latch;Term;Infant weight loss;Breastfeeding assistance (5.62% WL) Age:34 hours  P3, Term, Infant Female, 5.62% WL Experienced Breastfeeding Parent  LC entered the room per the birth parent's request. Per the birth parent, both of her older children had tongue and lip ties.  She stated that this baby does not appear to have a tongue tie, but was unsure about the lip.  LC assessed the infant's oral cavity and noted a possible labial frenulum, but the lingual frenulum did not appear tight.  LC told the birth parent to follow up with the pediatrician.  LC increased the size of the birth parent's nipple shield to a size #20 from a size #16.  The infant latched to the left breast in the cross-cradle position with the size #20 shield.  The birth parent stated that the size #20 felt better.  Lips were flanged, tongue was down, sucking was rhythmic, and swallows were heard. LC spoke with the birth parent about engorgement, infant I/O, outpatient services, and warning signs.  All questions were answered.   Current Feeding Plan:  Breastfeed 8+ times in 24 hours according to feeding cues.  Watch infant output and call the pediatrician with concerns.  Call outpatient [facility] for assistance with breastfeeding.   Maternal Data    Feeding Mother's Current Feeding Choice: Breast Milk  LATCH Score Latch: Grasps breast easily, tongue down, lips flanged, rhythmical sucking.  Audible Swallowing: Spontaneous and intermittent  Type of Nipple: Everted at rest and after stimulation  Comfort (Breast/Nipple): Filling, red/small blisters or bruises, mild/mod discomfort  Hold (Positioning): No assistance needed to correctly position infant at breast.  LATCH Score: 9   Lactation Tools Discussed/Used Tools: Nipple  Shields Nipple shield size: 20  Interventions Interventions: Breast feeding basics reviewed;Education  Discharge Discharge Education: Engorgement and breast care;Warning signs for feeding baby;Outpatient recommendation  Consult Status Consult Status: Complete Date: 03/25/22 Follow-up type: Call as needed    Lysbeth Penner 03/25/2022, 10:37 AM

## 2022-04-03 ENCOUNTER — Telehealth (HOSPITAL_COMMUNITY): Payer: Self-pay

## 2022-04-03 ENCOUNTER — Ambulatory Visit: Payer: PRIVATE HEALTH INSURANCE | Admitting: Family Medicine

## 2022-04-03 VITALS — BP 130/82 | HR 77 | Temp 98.1°F | Wt 136.2 lb

## 2022-04-03 DIAGNOSIS — J0100 Acute maxillary sinusitis, unspecified: Secondary | ICD-10-CM | POA: Diagnosis not present

## 2022-04-03 MED ORDER — AMOXICILLIN-POT CLAVULANATE 875-125 MG PO TABS: 1.0000 | ORAL_TABLET | Freq: Two times a day (BID) | ORAL | 0 refills | Status: DC

## 2022-04-03 NOTE — Progress Notes (Signed)
   Subjective:    Patient ID: Melanie Love, female    DOB: 02/26/1986, 36 y.o.   MRN: [id number]  HPI She is here for evaluation of probable sinusitis.  She did have difficulty with sinus disease mainly on the right side and was given Amoxil however her symptoms continued.  She is followed by her OB/GYN and was treated with a 5-day regimen of Augmentin which she states really did not do much good.  This was during her third trimester.  She is now 10 days postpartum and breast-feeding and seems to be doing fairly well except for sinus pressure mainly on the right-hand side postnasal drainage but no upper tooth discomfort, fever or chills.   Review of Systems     Objective:   Physical Exam Alert and in no distress. Tympanic membranes and canals are normal. Pharyngeal area is normal. Neck is supple without adenopathy or thyromegaly. Cardiac exam shows a regular sinus rhythm without murmurs or gallops. Lungs are clear to auscultation.  Nasal mucosa on the right is slightly erythematous with some tenderness over the right maxillary sinuses.        Assessment & Plan:  Acute maxillary sinusitis, recurrence not specified - Plan: amoxicillin-clavulanate (AUGMENTIN) 875-125 MG tablet I will treat her with a 10-day course and explained that it might take longer to get this under control.  Possibly using a second dose of that and potentially using steroids however before I would do that pediatrician concerning use of steroids in someone who is breast-feeding.

## 2022-04-03 NOTE — Telephone Encounter (Signed)
Patient did not answer phone call. Voicemail left for patient.   Sharyn Lull [facility] 04/03/2022,1835

## 2022-04-04 ENCOUNTER — Encounter: Payer: Self-pay | Admitting: Internal Medicine

## 2022-05-08 ENCOUNTER — Other Ambulatory Visit: Payer: Self-pay | Admitting: Family Medicine

## 2022-05-08 DIAGNOSIS — I10 Essential (primary) hypertension: Secondary | ICD-10-CM

## 2022-05-09 ENCOUNTER — Encounter: Payer: Self-pay | Admitting: Internal Medicine

## 2022-05-17 ENCOUNTER — Encounter: Payer: Self-pay | Admitting: *Deleted

## 2022-06-29 ENCOUNTER — Other Ambulatory Visit: Payer: Self-pay | Admitting: Family Medicine

## 2022-06-29 DIAGNOSIS — I10 Essential (primary) hypertension: Secondary | ICD-10-CM

## 2022-08-03 ENCOUNTER — Ambulatory Visit: Payer: No Typology Code available for payment source | Admitting: Internal Medicine

## 2022-09-05 ENCOUNTER — Other Ambulatory Visit: Payer: Self-pay | Admitting: Nurse Practitioner

## 2022-09-05 DIAGNOSIS — I10 Essential (primary) hypertension: Secondary | ICD-10-CM

## 2022-09-12 ENCOUNTER — Ambulatory Visit: Payer: PRIVATE HEALTH INSURANCE | Admitting: Nurse Practitioner

## 2022-09-12 ENCOUNTER — Encounter: Payer: Self-pay | Admitting: Nurse Practitioner

## 2022-09-12 VITALS — BP 120/80 | HR 89 | Ht 64.0 in | Wt 129.8 lb

## 2022-09-12 DIAGNOSIS — R002 Palpitations: Secondary | ICD-10-CM | POA: Diagnosis not present

## 2022-09-12 DIAGNOSIS — I10 Essential (primary) hypertension: Secondary | ICD-10-CM | POA: Diagnosis not present

## 2022-09-12 MED ORDER — NIFEDIPINE ER OSMOTIC RELEASE 30 MG PO TB24: 30.0000 mg | ORAL_TABLET | Freq: Three times a day (TID) | ORAL | 3 refills | Status: DC

## 2022-09-12 NOTE — Patient Instructions (Signed)
Take 1 dose at 7 am, the second dose at 1-2 pm, and your third dose at 7-9 pm (based on blood pressure) until we can make sure there is nothing going on with the thyroid or iron levels that could be triggering this.

## 2022-09-12 NOTE — Progress Notes (Signed)
Melanie Keeler, DNP, AGNP-c Melanie Love  [address] [place], [place] [address] Melanie Love and/or Follow-Up Visit  Blood pressure 120/80, pulse 89, height 5\' 4"  (1.626 m), weight 129 lb 12.8 oz (58.9 kg), SpO2 98 %, currently breastfeeding.    Melanie Love is a 37 y.o. year old female presenting today for evaluation and management of the following:  Melanie Love presents today with concerns regarding her blood pressure readings.  She recently has blood pressures recorded at 144/109 using a wrist cuff.  She has been experiencing issues with palpitations and is currently on a double dosage of nifedipine, having her daily dose increased from 30 mg to 60 mg about 2 weeks ago.  She notes that her blood pressure tends to spike in the afternoon, despite taking her medication at 7 AM and 7 PM.  Melanie Love previously transition from labetalol to nifedipine after the birth of her second child.  Melanie Love is currently breast-feeding and has a medical history that includes thyroid disease, anemia, and high blood pressure.  She is a mother of 3 with her most recent childbirth occurring in September of last year.  She reports experiencing palpitations approximately once a week, primarily in the evening, but sometimes they persist throughout the day.  Additionally she mentions tingling sensation in the hands, particularly on the left side, affecting her fingers and shoulder blades.  While the palpitations do not cause pain, they contributed to feelings of anxiety.  She expresses openness to adjusting her nifedipine dosage and possibly switching medications after she completes breast-feeding.  She has had aldosterone levels checked in the past given her Melanie Love onset of hypertension and these were all normal.   All ROS negative with exception of what is listed above.   PHYSICAL EXAM Physical Exam Vitals and nursing note reviewed.  Constitutional:      General: She  is not in acute distress.    Appearance: Normal appearance.  HENT:     Head: Normocephalic.  Eyes:     Extraocular Movements: Extraocular movements intact.     Conjunctiva/sclera: Conjunctivae normal.     Pupils: Pupils are equal, round, and reactive to light.  Neck:     Vascular: No carotid bruit.  Cardiovascular:     Rate and Rhythm: Normal rate and regular rhythm.     Pulses: Normal pulses.     Heart sounds: Normal heart sounds. No murmur heard. Pulmonary:     Effort: Pulmonary effort is normal.     Breath sounds: Normal breath sounds. No wheezing.  Abdominal:     General: Bowel sounds are normal. There is no distension.     Palpations: Abdomen is soft.     Tenderness: There is no abdominal tenderness. There is no guarding.  Musculoskeletal:        General: Normal range of motion.     Cervical back: Normal range of motion and neck supple.     Right lower leg: No edema.     Left lower leg: No edema.  Lymphadenopathy:     Cervical: No cervical adenopathy.  Skin:    General: Skin is warm and dry.     Capillary Refill: Capillary refill takes less than 2 seconds.  Neurological:     General: No focal deficit present.     Mental Status: She is alert and oriented to person, place, and time.  Psychiatric:        Mood and Affect: Mood normal.  Behavior: Behavior normal.     PLAN Problem List Items Addressed This Visit     Essential hypertension    [name] reports experiencing blood pressure spikes in the afternoon while on a current regimen of nifedipine 30 mg twice daily. Plan: - Increase nifedipine dosage to 30 mg during the daytime, 30mg  in midafternoon, and 30 mg at night, if blood pressures are greater than 130/80 when time for last dose. If blood pressures controlled skip last dose.  - Closely monitor blood pressure and adjust the dosage as necessary. - Schedule a follow-up phone call in 2-3 weeks to evaluate blood pressure control and medication tolerance.        Relevant Medications   NIFEdipine (PROCARDIA-XL/NIFEDICAL-XL) 30 MG 24 hr tablet   Other Relevant Orders   Comprehensive metabolic panel (Completed)   CBC with Differential/Platelet (Completed)   Iron, TIBC and Ferritin Panel (Completed)   TSH (Completed)   T4, free (Completed)   Intermittent palpitations - Primary    The patient experiences palpitations approximately once a week, which may be related to medication or hormonal changes. She does have a history of thyroid disease, which may also be contributing. We will monitor today.  Plan: - Continue to monitor palpitations and any associated symptoms. - Consider further evaluation if there is an increase in frequency or severity of palpitations.      Relevant Medications   NIFEdipine (PROCARDIA-XL/NIFEDICAL-XL) 30 MG 24 hr tablet   Other Relevant Orders   Comprehensive metabolic panel (Completed)   CBC with Differential/Platelet (Completed)   Iron, TIBC and Ferritin Panel (Completed)   TSH (Completed)   T4, free (Completed)    Return in about 2 weeks (around 09/26/2022) for 2-3 week phone call check on BP (can be when it's convenient for her).   Melanie Keeler, DNP, AGNP-c 09/12/2022 10:24 AM

## 2022-09-13 LAB — CBC WITH DIFFERENTIAL/PLATELET
Basophils Absolute: 0 10*3/uL (ref 0.0–0.2)
Basos: 0 %
EOS (ABSOLUTE): 0 10*3/uL (ref 0.0–0.4)
Eos: 1 %
Hematocrit: 43.3 % (ref 34.0–46.6)
Hemoglobin: 14.9 g/dL (ref 11.1–15.9)
Immature Grans (Abs): 0 10*3/uL (ref 0.0–0.1)
Immature Granulocytes: 0 %
Lymphocytes Absolute: 1 10*3/uL (ref 0.7–3.1)
Lymphs: 22 %
MCH: 30.5 pg (ref 26.6–33.0)
MCHC: 34.4 g/dL (ref 31.5–35.7)
MCV: 89 fL (ref 79–97)
Monocytes Absolute: 0.4 10*3/uL (ref 0.1–0.9)
Monocytes: 9 %
Neutrophils Absolute: 3 10*3/uL (ref 1.4–7.0)
Neutrophils: 68 %
Platelets: 204 10*3/uL (ref 150–450)
RBC: 4.88 x10E6/uL (ref 3.77–5.28)
RDW: 12.3 % (ref 11.7–15.4)
WBC: 4.4 10*3/uL (ref 3.4–10.8)

## 2022-09-13 LAB — IRON,TIBC AND FERRITIN PANEL
Ferritin: 74 ng/mL (ref 15–150)
Iron Saturation: 16 % (ref 15–55)
Iron: 49 ug/dL (ref 27–159)
Total Iron Binding Capacity: 306 ug/dL (ref 250–450)
UIBC: 257 ug/dL (ref 131–425)

## 2022-09-13 LAB — COMPREHENSIVE METABOLIC PANEL
ALT: 14 IU/L (ref 0–32)
AST: 17 IU/L (ref 0–40)
Albumin/Globulin Ratio: 2.3 — ABNORMAL HIGH (ref 1.2–2.2)
Albumin: 4.9 g/dL (ref 3.9–4.9)
Alkaline Phosphatase: 106 IU/L (ref 44–121)
BUN/Creatinine Ratio: 24 — ABNORMAL HIGH (ref 9–23)
BUN: 17 mg/dL (ref 6–20)
Bilirubin Total: 0.4 mg/dL (ref 0.0–1.2)
CO2: 23 mmol/L (ref 20–29)
Calcium: 10.1 mg/dL (ref 8.7–10.2)
Chloride: 105 mmol/L (ref 96–106)
Creatinine, Ser: 0.71 mg/dL (ref 0.57–1.00)
Globulin, Total: 2.1 g/dL (ref 1.5–4.5)
Glucose: 86 mg/dL (ref 70–99)
Potassium: 4.3 mmol/L (ref 3.5–5.2)
Sodium: 143 mmol/L (ref 134–144)
Total Protein: 7 g/dL (ref 6.0–8.5)
eGFR: 114 mL/min/{1.73_m2} (ref >59)

## 2022-09-13 LAB — TSH: TSH: 0.983 u[IU]/mL (ref 0.450–4.500)

## 2022-09-13 LAB — T4, FREE: Free T4: 1.3 ng/dL (ref 0.82–1.77)

## 2022-09-18 ENCOUNTER — Encounter: Payer: Self-pay | Admitting: Nurse Practitioner

## 2022-09-18 NOTE — Assessment & Plan Note (Signed)
The patient experiences palpitations approximately once a week, which may be related to medication or hormonal changes. She does have a history of thyroid disease, which may also be contributing. We will monitor today.  Plan: - Continue to monitor palpitations and any associated symptoms. - Consider further evaluation if there is an increase in frequency or severity of palpitations.

## 2022-09-18 NOTE — Assessment & Plan Note (Signed)
Melanie Love reports experiencing blood pressure spikes in the afternoon while on a current regimen of nifedipine 30 mg twice daily. Plan: - Increase nifedipine dosage to 30 mg during the daytime, 30mg  in midafternoon, and 30 mg at night, if blood pressures are greater than 130/80 when time for last dose. If blood pressures controlled skip last dose.  - Closely monitor blood pressure and adjust the dosage as necessary. - Schedule a follow-up phone call in 2-3 weeks to evaluate blood pressure control and medication tolerance.

## 2022-09-26 ENCOUNTER — Ambulatory Visit: Payer: No Typology Code available for payment source | Attending: Nurse Practitioner

## 2022-09-26 ENCOUNTER — Telehealth (INDEPENDENT_AMBULATORY_CARE_PROVIDER_SITE_OTHER): Payer: PRIVATE HEALTH INSURANCE | Admitting: Nurse Practitioner

## 2022-09-26 VITALS — BP 127/84 | Wt 130.0 lb

## 2022-09-26 DIAGNOSIS — R002 Palpitations: Secondary | ICD-10-CM

## 2022-09-26 DIAGNOSIS — R9431 Abnormal electrocardiogram [ECG] [EKG]: Secondary | ICD-10-CM | POA: Diagnosis not present

## 2022-09-26 DIAGNOSIS — I10 Essential (primary) hypertension: Secondary | ICD-10-CM

## 2022-09-26 NOTE — Progress Notes (Signed)
Virtual Visit Encounter mychart visit.   I connected with  Melanie Love on 10/02/22 at  4:30 PM EDT by secure video and audio telemedicine application. I verified that I am speaking with the correct person using two identifiers.   I introduced myself as a Publishing rights manager with the practice. The limitations of evaluation and management by telemedicine discussed with the patient and the availability of in person appointments. The patient expressed verbal understanding and consent to proceed.  Participating parties in this visit include: Myself and patient  The patient is: Patient Location: Other:  car I am: Provider Location: Office/Clinic Subjective:    CC and HPI: Melanie Love is a 37 y.o. year old female presenting for follow up of blood pressure and palpitations.  The patient presents today with reports of improved blood pressure readings, with a recent measurement of 127/84 mmHg, which is significantly better than the previous reading of 144 systolic. The patient has been experiencing palpitations but does not report any associated symptoms such as dizziness, nausea, or increased heart rate. The patient has a history of cardiology involvement and is considering the use of a Zio patch heart monitor to further assess the palpitations.  Past medical history, Surgical history, Family history not pertinant except as noted below, Social history, Allergies, and medications have been entered into the medical record, reviewed, and corrections made.   Review of Systems:  All review of systems negative except what is listed in the HPI  Objective:    Alert and oriented x 4 Speaking in clear sentences with no shortness of breath. No distress.  Impression and Recommendations:    Problem List Items Addressed This Visit     Essential hypertension - Primary    The patient reports improved blood pressure readings, with a recent measurement of 127/84, which is significantly better than the previous  144 systolic reading. The patient is currently on medication for hypertension. Plan: - Continue current medication regimen for hypertension. - Encourage the patient to monitor blood pressure regularly and report any significant changes. - Follow up as needed.      Relevant Orders   LONG TERM MONITOR (3-14 DAYS)   Intermittent palpitations    The patient reports occasional palpitations but no associated symptoms such as dizziness or nausea. The patient has a history of cardiology involvement. Plan: - Will order Zio patch today. Plan to reach out to cardiology (Dr. Ula Lingo) to let them this has been ordered. - Report any worsening or new symptoms related to palpitations. - Follow up with cardiology as needed.      Relevant Orders   LONG TERM MONITOR (3-14 DAYS)   Abnormal EKG    orders and follow up as documented in EMR I discussed the assessment and treatment plan with the patient. The patient was provided an opportunity to ask questions and all were answered. The patient agreed with the plan and demonstrated an understanding of the instructions.   The patient was advised to call back or seek an in-person evaluation if the symptoms worsen or if the condition fails to improve as anticipated.  Follow-Up: after labs and/or imaging, consults, etc. have been completed  I provided 22 minutes of non-face-to-face interaction with this non face-to-face encounter including intake, same-day documentation, and chart review.   Tollie Eth, NP , DNP, AGNP-c [facility]

## 2022-09-26 NOTE — Progress Notes (Unsigned)
Enrolled for [employer] to mail a ZIO XT long term holter monitor to the patients address on file.   Dr. Chandrasekhar to read. 

## 2022-10-02 ENCOUNTER — Encounter: Payer: Self-pay | Admitting: Nurse Practitioner

## 2022-10-02 NOTE — Assessment & Plan Note (Signed)
The patient reports occasional palpitations but no associated symptoms such as dizziness or nausea. The patient has a history of cardiology involvement. Plan: - Will order Zio patch today. Plan to reach out to cardiology (Dr. Ula Lingo) to let them this has been ordered. - Report any worsening or new symptoms related to palpitations. - Follow up with cardiology as needed.

## 2022-10-02 NOTE — Assessment & Plan Note (Signed)
The patient reports improved blood pressure readings, with a recent measurement of 127/84, which is significantly better than the previous 144 systolic reading. The patient is currently on medication for hypertension. Plan: - Continue current medication regimen for hypertension. - Encourage the patient to monitor blood pressure regularly and report any significant changes. - Follow up as needed.

## 2022-10-04 ENCOUNTER — Other Ambulatory Visit: Payer: Self-pay | Admitting: Nurse Practitioner

## 2022-10-04 DIAGNOSIS — R002 Palpitations: Secondary | ICD-10-CM

## 2022-10-04 DIAGNOSIS — I10 Essential (primary) hypertension: Secondary | ICD-10-CM

## 2022-10-20 NOTE — Progress Notes (Unsigned)
Cardiology Office Note:    Date:  10/23/2022   ID:  Melanie Love, DOB 1986-04-08, MRN [id number]  PCP:  Tollie Eth, NP   [facility] Providers Cardiologist:  Christell Constant, MD   Referring MD: Tollie Eth, NP   Chief Complaint  Patient presents with   Palpitations    History of Present Illness:    Melanie Love is a 37 y.o. female with a hx of HTN, HLD, history of postpartum gestational hypertension. She is a mother of three, and gave birth to her most recent child in 02/2022. She presents today for follow up of HTN and palpitations  Per chart review, patient was first seen by Dr. Graciela Husbands in 2014 for evaluation of dizziness. Per his note, patient was born prematurely at 18 weeks. In middle school, she developed hematuria, proteinuria, and hypertension. She started blood pressure medications when she was in college. When she was seen by Dr. Graciela Husbands, she complained of occasional palpitations and lightheadedness. Dr. Graciela Husbands suspected that she was mildly dysautonomic, and she was encouraged to stay hydrated. She was later seen by Dr. Izora Ribas in 02/2020, and she complained of palpitations. However, patient reported that her palpitations were not that bad. Dr. Izora Ribas ordered aldosterone+renin activity for evaluation of HTN, which was normal.   Patient was seen by her PCP on 09/12/22. At that appointment, patient complained of experiencing palpitations about once weekly. Palpitations usually occurred in the evenings, but sometimes persisted throughout the day. At that appointment, patient's nifedipine dose was increased to 30 mg TID. A zio patch was ordered to assess for palpitations, and she was instructed to follow up with cardiology.   Today, patient reports that her blood pressure has been very well controlled since her PCP increased her nifedipine.  She denies recent dizziness, lightheadedness, syncope, near syncope.  Denies shortness of breath or chest pain.  She has  been having intermittent episodes of palpitations.  These episodes occur mostly at night when she is at rest.  Feels like "her heart skips a beat".  The episodes are brief, but can occur multiple times over the course of few hours.  She usually has these palpitations about 2-3 days/week.  She is about 7 months postpartum and has been breast-feeding.  Reports that she has not been drinking enough water and does not get much sleep at night.  Her PCP had ordered a heart monitor for her to wear, but she did not have palpitations for about a week so she sent it back without wearing it.  After she sent the monitor back, she started to have palpitations again.   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   NIFEdipine (PROCARDIA-XL/NIFEDICAL-XL) 30 MG 24 hr tablet Take 1 tablet (30 mg total) by mouth 3 (three) times daily.   Omega-3 Fatty Acids (FISH OIL PO) Take 1 capsule by mouth daily.   Probiotic Product (PROBIOTIC DAILY PO)  Take 1 capsule by mouth daily.     Allergies:   Patient has no known allergies.   Social History   Socioeconomic History   Marital status: Married    Spouse name: Not on file   Number of children: Not on file   Years of education: Not on file   Highest education level: Not on file  Occupational History   Not on file  Tobacco Use   Smoking status: Never   Smokeless tobacco: Never  Vaping Use   Vaping Use: Never used  Substance and Sexual  Activity   Alcohol use: Not Currently    Comment: social   Drug use: No   Sexual activity: Yes    Birth control/protection: Condom  Other Topics Concern   Not on file  Social History Narrative   Not on file   Social Determinants of Health   Financial Resource Strain: Not on file  Food Insecurity: No Food Insecurity (03/23/2022)   Hunger Vital Sign    Worried About Running Out of Food in the Last Year: Never true    Ran Out of Food in the Last Year: Never true  Transportation Needs: No Transportation Needs (03/23/2022)   PRAPARE - Administrator, Civil Service (Medical): No    Lack of Transportation (Non-Medical): No  Physical Activity: Not on file  Stress: Not on file  Social Connections: Not on file     Family History: The patient's family history includes Alzheimer's disease in her maternal grandmother; Cancer in her father; Heart attack (age of onset: 49) in her maternal grandmother; Hyperlipidemia in her father; Hypertension in her mother; Thyroid disease in her mother. There is no history of Alcohol abuse, Arthritis, Asthma, Birth defects, COPD, Depression, Diabetes, Drug abuse, Early death, Hearing loss, Heart disease, Kidney disease, Learning disabilities, Mental illness, Mental retardation, Miscarriages / Stillbirths, Stroke, Vision loss, or Varicose Veins.  ROS:   Please see the history of present illness.     All other systems reviewed and are negative.  EKGs/Labs/Other Studies Reviewed:    The following studies were reviewed today:      EKG:  EKG is ordered today.  The ekg ordered today demonstrates normal sinus rhythm, HR 67 BPM. Short PR interval (98 ms)   Recent Labs: 09/12/2022: ALT 14; BUN 17; Creatinine, Ser 0.71; Hemoglobin 14.9; Platelets 204; Potassium 4.3; Sodium 143; TSH 0.983  Recent Lipid Panel    Component Value Date/Time   CHOL 247 (H) 03/08/2020 1112   TRIG 50 03/08/2020 1112   HDL 83 03/08/2020 1112   CHOLHDL 3.0 03/08/2020 1112    LDLCALC 156 (H) 03/08/2020 1112     Risk Assessment/Calculations:                Physical Exam:    VS:  BP 122/60 (BP Location: Left Arm, Patient Position: Sitting, Cuff Size: Normal)   Pulse 67   Ht 5\' 4"  (1.626 m)   Wt 129 lb 6.4 oz (58.7 kg)   SpO2 96%   BMI 22.21 kg/m     Wt Readings from Last 3 Encounters:  10/23/22 129 lb 6.4 oz (58.7 kg)  09/26/22 130 lb (59 kg)  09/12/22 129 lb 12.8 oz (58.9 kg)     GEN:  Well nourished, well developed in no acute distress. Sitting comfortably on the exam table  HEENT: Normal NECK: No JVD CARDIAC: RRR, no murmurs, rubs, gallops. Radial pulses 2_ bilaterally  RESPIRATORY:  Clear to auscultation without rales, wheezing  or rhonchi. Normal work of breathing on room air  MUSCULOSKELETAL:  No edema; No deformity  SKIN: Warm and dry NEUROLOGIC:  Alert and oriented x 3 PSYCHIATRIC:  Normal affect   ASSESSMENT:    1. Intermittent palpitations   2. Essential hypertension   3. Other hyperlipidemia    PLAN:    In order of problems listed above:  Palpitations - Patient was seen by PCP on 3/19 and complained of palpitations. TSH and free T4 were within normal limits. CBC and CMP were grossly normal. Her PCP ordered a monitor. After receiving the monitor, patient did not have palpitations for about a week, so she sent the monitor back without wearing it.  - Palpitations feel like her heart "skips a beat". Episodes occur about 2-3 days a week. Episodes are brief, but can occur multiple times within a few hours. Not associated with dizziness, syncope, near syncope  - Symptoms are most concerning for PVCs. Patient admits to not drinking enough water or eating enough calories, and she is currently breastfeeding her 48 month old daughter. She also does not get much sleep  - Discussed increasing oral hydration, decreasing caffeine intake, and eating regularly throughout the day  - Offered cardiac monitor, but patient would prefer to try  conservative treatments first. If she decides to wear a monitor or if palpitations worsen, she will contact the office   HTN  - Patient has been on BP medication since college. Reports that her PCP thoroughly worked her up a few years ago, and everything was normal. She did have renal ultrasounds in 2021 that showed normal appearing kidneys and renal arteries. Dr. Izora Ribas ordered aldosterone+renin activity in 2021, which was normal - Currently, patient is on nifedipine 30 mg TID  - BP well controlled today in office   HLD  - Lipid panel from 02/2020 showed LDL 156, HDL 83, triglycerides 50, total cholesterol 247  - Offered fasting lipid panel, but patient has already eaten today - She has a physical in June, and will get cholesterol checked at that appointment    Medication Adjustments/Labs and Tests Ordered: Current medicines are reviewed at length with the patient today.  Concerns regarding medicines are outlined above.  Orders Placed This Encounter  Procedures   EKG 12-Lead   No orders of the defined types were placed in this encounter.   Patient Instructions  Medication Instructions:  No Changes *If you need a refill on your cardiac medications before your next appointment, please call your pharmacy*  Lab Work: None Ordered  Testing/Procedures: None Ordered  Follow-Up: At [facility], you and your health needs are our priority.  As part of our continuing mission to provide you with exceptional heart care, we have created designated Provider Care Teams.  These Care Teams include your primary Cardiologist (physician) and Advanced Practice Providers (APPs -  Physician Assistants and Nurse Practitioners) who all work together to provide you with the care you need, when you need it.  We recommend signing up for the patient portal called "MyChart".  Sign up information is provided on this After Visit Summary.  MyChart is used to connect with patients for Virtual  Visits (Telemedicine).  Patients are able to view lab/test results, encounter notes, upcoming appointments, etc.  Non-urgent messages can be sent to your provider as well.   To learn more about what you can do with MyChart, go to [email or web address].    Your next appointment:   6 month(s)  Provider:   Verdon Cummins  Molli Hazard, FNP or Robet Leu, PA-C       Other Instructions Your provider suggests that you increase your oral hydration to 130 ounces (15-16 Cups) or more daily.   Signed, Jonita Albee, PA-C  10/23/2022 9:49 AM    [facility]

## 2022-10-23 ENCOUNTER — Encounter: Payer: Self-pay | Admitting: General Practice

## 2022-10-23 ENCOUNTER — Ambulatory Visit: Payer: No Typology Code available for payment source | Attending: General Practice | Admitting: Cardiology

## 2022-10-23 VITALS — BP 122/60 | HR 67 | Ht 64.0 in | Wt 129.4 lb

## 2022-10-23 DIAGNOSIS — E7849 Other hyperlipidemia: Secondary | ICD-10-CM

## 2022-10-23 DIAGNOSIS — R002 Palpitations: Secondary | ICD-10-CM

## 2022-10-23 DIAGNOSIS — I10 Essential (primary) hypertension: Secondary | ICD-10-CM

## 2022-10-23 NOTE — Patient Instructions (Signed)
Medication Instructions:  No Changes *If you need a refill on your cardiac medications before your next appointment, please call your pharmacy*  Lab Work: None Ordered  Testing/Procedures: None Ordered  Follow-Up: At [facility], you and your health needs are our priority.  As part of our continuing mission to provide you with exceptional heart care, we have created designated Provider Care Teams.  These Care Teams include your primary Cardiologist (physician) and Advanced Practice Providers (APPs -  Physician Assistants and Nurse Practitioners) who all work together to provide you with the care you need, when you need it.  We recommend signing up for the patient portal called "MyChart".  Sign up information is provided on this After Visit Summary.  MyChart is used to connect with patients for Virtual Visits (Telemedicine).  Patients are able to view lab/test results, encounter notes, upcoming appointments, etc.  Non-urgent messages can be sent to your provider as well.   To learn more about what you can do with MyChart, go to [email or web address].    Your next appointment:   6 month(s)  Provider:   Edd Fabian, FNP or Robet Leu, PA-C       Other Instructions Your provider suggests that you increase your oral hydration to 130 ounces (15-16 Cups) or more daily.

## 2022-12-10 ENCOUNTER — Other Ambulatory Visit: Payer: Self-pay | Admitting: Nurse Practitioner

## 2022-12-10 DIAGNOSIS — I10 Essential (primary) hypertension: Secondary | ICD-10-CM

## 2022-12-10 DIAGNOSIS — R002 Palpitations: Secondary | ICD-10-CM

## 2022-12-14 ENCOUNTER — Encounter: Payer: PRIVATE HEALTH INSURANCE | Admitting: Nurse Practitioner

## 2022-12-14 DIAGNOSIS — Z0000 Encounter for general adult medical examination without abnormal findings: Secondary | ICD-10-CM

## 2023-02-07 ENCOUNTER — Ambulatory Visit: Payer: No Typology Code available for payment source | Admitting: Internal Medicine

## 2023-02-13 ENCOUNTER — Encounter: Payer: PRIVATE HEALTH INSURANCE | Admitting: Nurse Practitioner

## 2023-03-14 ENCOUNTER — Other Ambulatory Visit: Payer: Self-pay | Admitting: Nurse Practitioner

## 2023-03-14 DIAGNOSIS — R002 Palpitations: Secondary | ICD-10-CM

## 2023-03-14 DIAGNOSIS — I10 Essential (primary) hypertension: Secondary | ICD-10-CM

## 2023-03-30 ENCOUNTER — Other Ambulatory Visit: Payer: Self-pay | Admitting: Nurse Practitioner

## 2023-03-30 DIAGNOSIS — I10 Essential (primary) hypertension: Secondary | ICD-10-CM

## 2023-03-30 DIAGNOSIS — R002 Palpitations: Secondary | ICD-10-CM

## 2023-04-18 ENCOUNTER — Encounter: Payer: PRIVATE HEALTH INSURANCE | Admitting: Nurse Practitioner

## 2023-06-13 ENCOUNTER — Ambulatory Visit: Payer: PRIVATE HEALTH INSURANCE | Admitting: Nurse Practitioner

## 2023-06-13 ENCOUNTER — Encounter: Payer: Self-pay | Admitting: Nurse Practitioner

## 2023-06-13 VITALS — BP 120/82 | HR 74 | Ht 64.0 in | Wt 129.6 lb

## 2023-06-13 DIAGNOSIS — Q279 Congenital malformation of peripheral vascular system, unspecified: Secondary | ICD-10-CM

## 2023-06-13 DIAGNOSIS — Z0000 Encounter for general adult medical examination without abnormal findings: Secondary | ICD-10-CM

## 2023-06-13 DIAGNOSIS — I10 Essential (primary) hypertension: Secondary | ICD-10-CM | POA: Diagnosis not present

## 2023-06-13 DIAGNOSIS — R002 Palpitations: Secondary | ICD-10-CM

## 2023-06-13 DIAGNOSIS — E7849 Other hyperlipidemia: Secondary | ICD-10-CM

## 2023-06-13 LAB — LIPID PANEL

## 2023-06-13 MED ORDER — NIFEDIPINE ER OSMOTIC RELEASE 30 MG PO TB24: 30.0000 mg | ORAL_TABLET | Freq: Three times a day (TID) | ORAL | 3 refills | Status: DC

## 2023-06-13 NOTE — Assessment & Plan Note (Signed)
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 No clots detected. Asymptomatic and managing conservatively due to high out-of-pocket costs for procedures. Discussed conservative management options including compression stockings, elevating feet, and avoiding prolonged standing. - Use compression stockings - Elevate feet when sitting - Avoid prolonged standing

## 2023-06-13 NOTE — Assessment & Plan Note (Signed)

## 2023-06-13 NOTE — Assessment & Plan Note (Signed)
Hypertension is well-controlled with current nifedipine regimen (30 mg TID). - Continue nifedipine 30 mg TID - Send a year's prescription to [facility] on [address] - Monitor blood pressure regularly - Adjust dosage if blood pressure decreases with increased exercise

## 2023-06-13 NOTE — Assessment & Plan Note (Signed)
Labs pending.Continue diet and exercise management.

## 2023-06-13 NOTE — Assessment & Plan Note (Signed)
Intermittent PACs/PVCs previously evaluated by cardiology. Symptoms resolved with increased hydration. Discussed potential use of beta-blockers if symptoms recur and the importance of hydration. - Increase water intake if symptoms recur - Consider beta-blockers (propranolol, atenolol, metoprolol) if symptoms persist - Follow up with primary care if symptoms return

## 2023-06-13 NOTE — Patient Instructions (Addendum)
You look fabulous today! If you have any issues please don't hesitate to reach out.     For all adult patients, I recommend A well balanced diet low in saturated fats, cholesterol, and moderation in carbohydrates.   This can be as simple as monitoring portion sizes and cutting back on sugary beverages such as soda and juice to start with.    Daily water consumption of at least 64 ounces.  Physical activity at least 180 minutes per week, if just starting out.   This can be as simple as taking the stairs instead of the elevator and walking 2-3 laps around the office  purposefully every day.   STD protection, partner selection, and regular testing if high risk.  Limited consumption of alcoholic beverages if alcohol is consumed.  For women, I recommend no more than 7 alcoholic beverages per week, spread out throughout the week.  Avoid "binge" drinking or consuming large quantities of alcohol in one setting.   Please let me know if you feel you may need help with reduction or quitting alcohol consumption.   Avoidance of nicotine, if used.  Please let me know if you feel you may need help with reduction or quitting nicotine use.   Daily mental health attention.  This can be in the form of 5 minute daily meditation, prayer, journaling, yoga, reflection, etc.   Purposeful attention to your emotions and mental state can significantly improve your overall wellbeing  and  Health.  Please know that I am here to help you with all of your health care goals and am happy to work with you to find a solution that works best for you.  The greatest advice I have received with any changes in life are to take it one step at a time, that even means if all you can focus on is the next 60 seconds, then do that and celebrate your victories.  With any changes in life, you will have set backs, and that is OK. The important thing to remember is, if you have a set back, it is not a failure, it is an opportunity to  try again!  Health Maintenance Recommendations Screening Testing Mammogram Every 1 -2 years based on history and risk factors Starting at age 88 Pap Smear Ages 21-39 every 3 years Ages 71-65 every 5 years with HPV testing More frequent testing may be required based on results and history Colon Cancer Screening Every 1-10 years based on test performed, risk factors, and history Starting at age 44 Bone Density Screening Every 2-10 years based on history Starting at age 61 for women Recommendations for men differ based on medication usage, history, and risk factors AAA Screening One time ultrasound Men 36-23 years old who have every smoked Lung Cancer Screening Low Dose Lung CT every 12 months Age 69-80 years with a 30 pack-year smoking history who still smoke or who have quit within the last 15 years  Screening Labs Routine  Labs: Complete Blood Count (CBC), Complete Metabolic Panel (CMP), Cholesterol (Lipid Panel) Every 6-12 months based on history and medications May be recommended more frequently based on current conditions or previous results Hemoglobin A1c Lab Every 3-12 months based on history and previous results Starting at age 29 or earlier with diagnosis of diabetes, high cholesterol, BMI >26, and/or risk factors Frequent monitoring for patients with diabetes to ensure blood sugar control Thyroid Panel (TSH w/ T3 & T4) Every 6 months based on history, symptoms, and risk factors May be  repeated more often if on medication HIV One time testing for all patients 13 and older May be repeated more frequently for patients with increased risk factors or exposure Hepatitis C One time testing for all patients 71 and older May be repeated more frequently for patients with increased risk factors or exposure Gonorrhea, Chlamydia Every 12 months for all sexually active persons 13-24 years Additional monitoring may be recommended for those who are considered high risk or who have  symptoms PSA Men 16-15 years old with risk factors Additional screening may be recommended from age 59-69 based on risk factors, symptoms, and history  Vaccine Recommendations Tetanus Booster All adults every 10 years Flu Vaccine All patients 6 months and older every year COVID Vaccine All patients 12 years and older Initial dosing with booster May recommend additional booster based on age and health history HPV Vaccine 2 doses all patients age 69-26 Dosing may be considered for patients over 26 Shingles Vaccine (Shingrix) 2 doses all adults 55 years and older Pneumonia (Pneumovax 23) All adults 65 years and older May recommend earlier dosing based on health history Pneumonia (Prevnar 87) All adults 65 years and older Dosed 1 year after Pneumovax 23  Additional Screening, Testing, and Vaccinations may be recommended on an individualized basis based on family history, health history, risk factors, and/or exposure.

## 2023-06-13 NOTE — Progress Notes (Signed)
Melanie Clamp, DNP, AGNP-c [facility] [address] [place], [place] [address] Main Office [phone]  BP 120/82   Pulse 74   Ht 5\' 4"  (1.626 m)   Wt 129 lb 9.6 oz (58.8 kg)   LMP 06/06/2023   Breastfeeding No   BMI 22.25 kg/m    Subjective:    Patient ID: Melanie Love, female    DOB: 1986-05-17, 37 y.o.   MRN: [id number]  HPI: Melanie Love is a 37 y.o. female presenting on 06/13/2023 for comprehensive medical examination.   History of Present Illness Zali presents for a routine physical examination. She reports no current health concerns. Her blood pressure, previously a concern postpartum, is now well-controlled with thrice daily nifedipine. She also reports a history of palpitations, described as a sensation of skipped heartbeats, which were evaluated by cardiology and diagnosed as premature atrial or ventricular contractions (PACs or PVCs). These symptoms have since resolved, potentially due to increased hydration as the patient was previously not consuming adequate water while breastfeeding.  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 She has chosen not to pursue treatment due to out-of-pocket costs, as she is currently asymptomatic. She plans to manage the condition with compression stockings, elevation, and avoiding prolonged standing.  The patient has a family history of Alzheimer's disease and is considering genetic testing to assess her risk. She also notes that she is due for a skin cancer screening, having not had one in approximately ten years. She has not noticed any new skin changes.  The patient is currently not experiencing any difficulty swallowing, changes in bowel or bladder habits, or menstrual irregularities. She recently resumed menstruation after ceasing breastfeeding. She also expresses a desire to resume regular exercise.  Pertinent items are noted in HPI.  IMMUNIZATIONS:   Flu  Vaccine: Flu vaccine completed elsewhere this season. Record updated. Prevnar 13: Prevnar 13 N/A for this patient Prevnar 20: Prevnar 20 N/A for this patient Pneumovax 23: Pneumovax 23 N/A for this patient Vac Shingrix: Shingrix N/A for this patient HPV: N/A or Aged Out Tetanus: Tetanus completed in the last 10 years COVID: COVID completed, documentation in chart  RSV: No  HEALTH MAINTENANCE: Pap Smear HM Status: is up to date- need records Mammogram HM Status: N/A Colon Cancer Screening HM Status: N/A Bone Density HM Status: N/A STI Testing HM Status: was declined  Lung CT HM Status: N/A  Concerns with vision, hearing, or dentition: No  Most Recent Depression Screen:     06/13/2023    8:22 AM 09/12/2022   10:29 AM 11/07/2018    2:42 PM 07/27/2016    3:04 PM  Depression screen PHQ 2/9  Decreased Interest 0 0 0 0  Down, Depressed, Hopeless 0 0 0 0  PHQ - 2 Score 0 0 0 0   Most Recent Anxiety Screen:      No data to display         Most Recent Fall Screen:    06/13/2023    8:22 AM 09/12/2022   10:29 AM 11/07/2018    2:42 PM 07/27/2016    3:04 PM  Fall Risk   Falls in the past year? 0 0 0 No  Number falls in past yr: 0 0 0   Injury with Fall? 0 0 0   Risk for fall due to : No Fall Risks No Fall Risks    Follow up Falls evaluation completed Falls evaluation completed  Past medical history, surgical history, medications, allergies, family history and social history reviewed with patient today and changes made to appropriate areas of the chart.  Past Medical History:  Past Medical History:  Diagnosis Date   Complication of anesthesia    Dizziness    Dizziness    Extreme prematurity 03/08/2020   Extreme prematurity <30 weeks    H/O hematuria -Proteinuria    History of hyperlipidemia 11/07/2018   History of postpartum gestational hypertension    Hypertension    NSVD (normal spontaneous vaginal delivery) 12/19/2019   PONV (postoperative nausea and vomiting)     Medications:  Current Outpatient Medications on File Prior to Visit  Medication Sig   Multiple Vitamin (MULTIVITAMIN) tablet Take 1 tablet by mouth daily.   Omega-3 Fatty Acids (FISH OIL PO) Take 1 capsule by mouth daily.   Probiotic Product (PROBIOTIC DAILY PO) Take 1 capsule by mouth daily.   No current facility-administered medications on file prior to visit.   Surgical History:  Past Surgical History:  Procedure Laterality Date   DENTAL SURGERY     NO PAST SURGERIES     Allergies:  No Known Allergies Family History:  Family History  Problem Relation Age of Onset   Hypertension Mother    Thyroid disease Mother    Cancer Father    Hyperlipidemia Father    Alzheimer's disease Maternal Grandmother    Heart attack Maternal Grandmother 58   Alcohol abuse Neg Hx    Arthritis Neg Hx    Asthma Neg Hx    Birth defects Neg Hx    COPD Neg Hx    Depression Neg Hx    Diabetes Neg Hx    Drug abuse Neg Hx    Jah Alarid death Neg Hx    Hearing loss Neg Hx    Heart disease Neg Hx    Kidney disease Neg Hx    Learning disabilities Neg Hx    Mental illness Neg Hx    Mental retardation Neg Hx    Miscarriages / Stillbirths Neg Hx    Stroke Neg Hx    Vision loss Neg Hx    Varicose Veins Neg Hx        Objective:    BP 120/82   Pulse 74   Ht 5\' 4"  (1.626 m)   Wt 129 lb 9.6 oz (58.8 kg)   LMP 06/06/2023   Breastfeeding No   BMI 22.25 kg/m   Wt Readings from Last 3 Encounters:  06/13/23 129 lb 9.6 oz (58.8 kg)  10/23/22 129 lb 6.4 oz (58.7 kg)  09/26/22 130 lb (59 kg)    Physical Exam Vitals and nursing note reviewed.  Constitutional:      General: She is not in acute distress.    Appearance: Normal appearance.  HENT:     Head: Normocephalic and atraumatic.     Right Ear: Hearing, tympanic membrane, ear canal and external ear normal.     Left Ear: Hearing, tympanic membrane, ear canal and external ear normal.     Nose: Nose normal.     Right Sinus: No maxillary sinus  tenderness or frontal sinus tenderness.     Left Sinus: No maxillary sinus tenderness or frontal sinus tenderness.     Mouth/Throat:     Lips: Pink.     Mouth: Mucous membranes are moist.     Pharynx: Oropharynx is clear.  Eyes:     General: Lids are normal. Vision grossly intact.  Extraocular Movements: Extraocular movements intact.     Conjunctiva/sclera: Conjunctivae normal.     Pupils: Pupils are equal, round, and reactive to light.     Funduscopic exam:    Right eye: Red reflex present.        Left eye: Red reflex present.    Visual Fields: Right eye visual fields normal and left eye visual fields normal.  Neck:     Thyroid: No thyromegaly.     Vascular: No carotid bruit.  Cardiovascular:     Rate and Rhythm: Normal rate and regular rhythm.     Chest Wall: PMI is not displaced.     Pulses: Normal pulses.          Dorsalis pedis pulses are 2+ on the right side and 2+ on the left side.       Posterior tibial pulses are 2+ on the right side and 2+ on the left side.     Heart sounds: Normal heart sounds. No murmur heard. Pulmonary:     Effort: Pulmonary effort is normal. No respiratory distress.     Breath sounds: Normal breath sounds.  Abdominal:     General: Abdomen is flat. Bowel sounds are normal. There is no distension.     Palpations: Abdomen is soft. There is no hepatomegaly, splenomegaly or mass.     Tenderness: There is no abdominal tenderness. There is no right CVA tenderness, left CVA tenderness, guarding or rebound.  Musculoskeletal:        General: Normal range of motion.     Cervical back: Full passive range of motion without pain, normal range of motion and neck supple. No tenderness.     Right lower leg: No edema.     Left lower leg: No edema.  Feet:     Left foot:     Toenail Condition: Left toenails are normal.  Lymphadenopathy:     Cervical: No cervical adenopathy.     Upper Body:     Right upper body: No supraclavicular adenopathy.     Left upper  body: No supraclavicular adenopathy.  Skin:    General: Skin is warm and dry.     Capillary Refill: Capillary refill takes less than 2 seconds.     Nails: There is no clubbing.  Neurological:     General: No focal deficit present.     Mental Status: She is alert and oriented to person, place, and time.     GCS: GCS eye subscore is 4. GCS verbal subscore is 5. GCS motor subscore is 6.     Sensory: Sensation is intact.     Motor: Motor function is intact.     Coordination: Coordination is intact.     Gait: Gait is intact.     Deep Tendon Reflexes: Reflexes are normal and symmetric.  Psychiatric:        Attention and Perception: Attention normal.        Mood and Affect: Mood normal.        Speech: Speech normal.        Behavior: Behavior normal. Behavior is cooperative.        Thought Content: Thought content normal.        Cognition and Memory: Cognition and memory normal.        Judgment: Judgment normal.      Results for orders placed or performed in visit on 09/12/22  Comprehensive metabolic panel   Collection Time: 09/12/22 11:21 AM  Result Value Ref Range  Glucose 86 70 - 99 mg/dL   BUN 17 6 - 20 mg/dL   Creatinine, Ser 1.61 0.57 - 1.00 mg/dL   eGFR 096 >04 VW/UJW/1.19   BUN/Creatinine Ratio 24 (H) 9 - 23   Sodium 143 134 - 144 mmol/L   Potassium 4.3 3.5 - 5.2 mmol/L   Chloride 105 96 - 106 mmol/L   CO2 23 20 - 29 mmol/L   Calcium 10.1 8.7 - 10.2 mg/dL   Total Protein 7.0 6.0 - 8.5 g/dL   Albumin 4.9 3.9 - 4.9 g/dL   Globulin, Total 2.1 1.5 - 4.5 g/dL   Albumin/Globulin Ratio 2.3 (H) 1.2 - 2.2   Bilirubin Total 0.4 0.0 - 1.2 mg/dL   Alkaline Phosphatase 106 44 - 121 IU/L   AST 17 0 - 40 IU/L   ALT 14 0 - 32 IU/L  CBC with Differential/Platelet   Collection Time: 09/12/22 11:21 AM  Result Value Ref Range   WBC 4.4 3.4 - 10.8 x10E3/uL   RBC 4.88 3.77 - 5.28 x10E6/uL   Hemoglobin 14.9 11.1 - 15.9 g/dL   Hematocrit 14.7 82.9 - 46.6 %   MCV 89 79 - 97 fL   MCH  30.5 26.6 - 33.0 pg   MCHC 34.4 31.5 - 35.7 g/dL   RDW 56.2 13.0 - 86.5 %   Platelets 204 150 - 450 x10E3/uL   Neutrophils 68 Not Estab. %   Lymphs 22 Not Estab. %   Monocytes 9 Not Estab. %   Eos 1 Not Estab. %   Basos 0 Not Estab. %   Neutrophils Absolute 3.0 1.4 - 7.0 x10E3/uL   Lymphocytes Absolute 1.0 0.7 - 3.1 x10E3/uL   Monocytes Absolute 0.4 0.1 - 0.9 x10E3/uL   EOS (ABSOLUTE) 0.0 0.0 - 0.4 x10E3/uL   Basophils Absolute 0.0 0.0 - 0.2 x10E3/uL   Immature Granulocytes 0 Not Estab. %   Immature Grans (Abs) 0.0 0.0 - 0.1 x10E3/uL  Iron, TIBC and Ferritin Panel   Collection Time: 09/12/22 11:21 AM  Result Value Ref Range   Total Iron Binding Capacity 306 250 - 450 ug/dL   UIBC 784 696 - 295 ug/dL   Iron 49 27 - 284 ug/dL   Iron Saturation 16 15 - 55 %   Ferritin 74 15 - 150 ng/mL  TSH   Collection Time: 09/12/22 11:21 AM  Result Value Ref Range   TSH 0.983 0.450 - 4.500 uIU/mL  T4, free   Collection Time: 09/12/22 11:21 AM  Result Value Ref Range   Free T4 1.30 0.82 - 1.77 ng/dL       Assessment & Plan:   Problem List Items Addressed This Visit     Essential hypertension   Hypertension is well-controlled with current nifedipine regimen (30 mg TID). - Continue nifedipine 30 mg TID - Send a year's prescription to [facility] on [address] - Monitor blood pressure regularly - Adjust dosage if blood pressure decreases with increased exercise      Relevant Medications   NIFEdipine (PROCARDIA-XL/NIFEDICAL-XL) 30 MG 24 hr tablet   Other Relevant Orders   CBC with Differential/Platelet   CMP14+EGFR   Lipid panel   Intermittent palpitations   Intermittent PACs/PVCs previously evaluated by cardiology. Symptoms resolved with increased hydration. Discussed potential use of beta-blockers if symptoms recur and the importance of hydration. - Increase water intake if symptoms recur - Consider beta-blockers (propranolol, atenolol, metoprolol) if symptoms persist - Follow up with  primary care if symptoms return  Relevant Medications   NIFEdipine (PROCARDIA-XL/NIFEDICAL-XL) 30 MG 24 hr tablet   Other Relevant Orders   CBC with Differential/Platelet   CMP14+EGFR   Lipid panel   Other hyperlipidemia   Labs pending.Continue diet and exercise management.       Relevant Medications   NIFEdipine (PROCARDIA-XL/NIFEDICAL-XL) 30 MG 24 hr tablet   Other Relevant Orders   CBC with Differential/Platelet   CMP14+EGFR   Lipid panel   Encounter for annual physical exam - Primary   CPE completed today. Review of HM activities and recommendations discussed and provided on AVS. Anticipatory guidance, diet, and exercise recommendations provided. Medications, allergies, and hx reviewed and updated as necessary. Orders placed as listed below.  Plan: - Labs ordered. Will make changes as necessary based on results.  - I will review these results and send recommendations via MyChart or a telephone call.  - F/U with CPE in 1 year or sooner for acute/chronic health needs as directed.        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 No clots detected. Asymptomatic and managing conservatively due to high out-of-pocket costs for procedures. Discussed conservative management options including compression stockings, elevating feet, and avoiding prolonged standing. - Use compression stockings - Elevate feet when sitting - Avoid prolonged standing      Relevant Medications   NIFEdipine (PROCARDIA-XL/NIFEDICAL-XL) 30 MG 24 hr tablet      Follow up plan: Return in about 1 year (around 06/12/2024) for CPE.  NEXT PREVENTATIVE PHYSICAL DUE IN 1 YEAR.  PATIENT COUNSELING PROVIDED FOR ALL ADULT PATIENTS: A well balanced diet low in saturated fats, cholesterol, and moderation in carbohydrates.  This can be as simple as monitoring portion sizes and cutting back on sugary beverages such as soda and juice to start with.    Daily water consumption  of at least 64 ounces.  Physical activity at least 180 minutes per week.  If just starting out, start 10 minutes a day and work your way up.   This can be as simple as taking the stairs instead of the elevator and walking 2-3 laps around the office  purposefully every day.   STD protection, partner selection, and regular testing if high risk.  Limited consumption of alcoholic beverages if alcohol is consumed. For men, I recommend no more than 14 alcoholic beverages per week, spread out throughout the week (max 2 per day). Avoid "binge" drinking or consuming large quantities of alcohol in one setting.  Please let me know if you feel you may need help with reduction or quitting alcohol consumption.   Avoidance of nicotine, if used. Please let me know if you feel you may need help with reduction or quitting nicotine use.   Daily mental health attention. This can be in the form of 5 minute daily meditation, prayer, journaling, yoga, reflection, etc.  Purposeful attention to your emotions and mental state can significantly improve your overall wellbeing  and  Health.  Please know that I am here to help you with all of your health care goals and am happy to work with you to find a solution that works best for you.  The greatest advice I have received with any changes in life are to take it one step at a time, that even means if all you can focus on is the next 60 seconds, then do that and celebrate your victories.  With any changes in life, you will have set backs, and that  is OK. The important thing to remember is, if you have a set back, it is not a failure, it is an opportunity to try again! Screening Testing Mammogram Every 1 -2 years based on history and risk factors Starting at age 68 Pap Smear Ages 21-39 every 3 years Ages 54-65 every 5 years with HPV testing More frequent testing may be required based on results and history Colon Cancer Screening Every 1-10 years based on test  performed, risk factors, and history Starting at age 48 Bone Density Screening Every 2-10 years based on history Starting at age 54 for women Recommendations for men differ based on medication usage, history, and risk factors AAA Screening One time ultrasound Men 2-27 years old who have every smoked Lung Cancer Screening Low Dose Lung CT every 12 months Age 70-80 years with a 30 pack-year smoking history who still smoke or who have quit within the last 15 years   Screening Labs Routine  Labs: Complete Blood Count (CBC), Complete Metabolic Panel (CMP), Cholesterol (Lipid Panel) Every 6-12 months based on history and medications May be recommended more frequently based on current conditions or previous results Hemoglobin A1c Lab Every 3-12 months based on history and previous results Starting at age 61 or earlier with diagnosis of diabetes, high cholesterol, BMI >26, and/or risk factors Frequent monitoring for patients with diabetes to ensure blood sugar control Thyroid Panel (TSH) Every 6 months based on history, symptoms, and risk factors May be repeated more often if on medication HIV One time testing for all patients 91 and older May be repeated more frequently for patients with increased risk factors or exposure Hepatitis C One time testing for all patients 41 and older May be repeated more frequently for patients with increased risk factors or exposure Gonorrhea, Chlamydia Every 12 months for all sexually active persons 13-24 years Additional monitoring may be recommended for those who are considered high risk or who have symptoms Every 12 months for any woman on birth control, regardless of sexual activity PSA Men 82-51 years old with risk factors Additional screening may be recommended from age 62-69 based on risk factors, symptoms, and history  Vaccine Recommendations Tetanus Booster All adults every 10 years Flu Vaccine All patients 6 months and older every  year COVID Vaccine All patients 12 years and older Initial dosing with booster May recommend additional booster based on age and health history HPV Vaccine 2 doses all patients age 99-26 Dosing may be considered for patients over 26 Shingles Vaccine (Shingrix) 2 doses all adults 55 years and older Pneumonia (Pneumovax 3) All adults 65 years and older May recommend earlier dosing based on health history One year apart from Prevnar 42 Pneumonia (Prevnar 39) All adults 65 years and older Dosed 1 year after Pneumovax 23 Pneumonia (Prevnar 20) One time alternative to the two dosing of 13 and 23 For all adults with initial dose of 23, 20 is recommended 1 year later For all adults with initial dose of 13, 23 is still recommended as second option 1 year later

## 2023-06-14 LAB — CBC WITH DIFFERENTIAL/PLATELET
Basophils Absolute: 0 x10E3/uL (ref 0.0–0.2)
Basos: 1 %
EOS (ABSOLUTE): 0.1 x10E3/uL (ref 0.0–0.4)
Eos: 2 %
Hematocrit: 44.4 % (ref 34.0–46.6)
Hemoglobin: 14.3 g/dL (ref 11.1–15.9)
Immature Grans (Abs): 0 x10E3/uL (ref 0.0–0.1)
Immature Granulocytes: 0 %
Lymphocytes Absolute: 1.1 x10E3/uL (ref 0.7–3.1)
Lymphs: 33 %
MCH: 30 pg (ref 26.6–33.0)
MCHC: 32.2 g/dL (ref 31.5–35.7)
MCV: 93 fL (ref 79–97)
Monocytes Absolute: 0.3 x10E3/uL (ref 0.1–0.9)
Monocytes: 8 %
Neutrophils Absolute: 1.9 x10E3/uL (ref 1.4–7.0)
Neutrophils: 56 %
Platelets: 227 x10E3/uL (ref 150–450)
RBC: 4.76 x10E6/uL (ref 3.77–5.28)
RDW: 12.5 % (ref 11.7–15.4)
WBC: 3.4 x10E3/uL (ref 3.4–10.8)

## 2023-06-14 LAB — CMP14+EGFR
ALT: 16 IU/L (ref 0–32)
AST: 16 IU/L (ref 0–40)
Albumin: 4.7 g/dL (ref 3.9–4.9)
Alkaline Phosphatase: 74 IU/L (ref 44–121)
BUN/Creatinine Ratio: 21 (ref 9–23)
BUN: 15 mg/dL (ref 6–20)
Bilirubin Total: 0.4 mg/dL (ref 0.0–1.2)
CO2: 25 mmol/L (ref 20–29)
Calcium: 9.3 mg/dL (ref 8.7–10.2)
Chloride: 104 mmol/L (ref 96–106)
Creatinine, Ser: 0.72 mg/dL (ref 0.57–1.00)
Globulin, Total: 1.8 g/dL (ref 1.5–4.5)
Glucose: 88 mg/dL (ref 70–99)
Potassium: 4.7 mmol/L (ref 3.5–5.2)
Sodium: 141 mmol/L (ref 134–144)
Total Protein: 6.5 g/dL (ref 6.0–8.5)
eGFR: 112 mL/min/1.73

## 2023-06-14 LAB — LIPID PANEL
Chol/HDL Ratio: 2.9 ratio (ref 0.0–4.4)
Cholesterol, Total: 201 mg/dL — ABNORMAL HIGH (ref 100–199)
HDL: 70 mg/dL
LDL Chol Calc (NIH): 121 mg/dL — ABNORMAL HIGH (ref 0–99)
Triglycerides: 56 mg/dL (ref 0–149)
VLDL Cholesterol Cal: 10 mg/dL (ref 5–40)

## 2023-06-29 ENCOUNTER — Ambulatory Visit: Payer: PRIVATE HEALTH INSURANCE | Admitting: Medical

## 2023-06-29 ENCOUNTER — Encounter: Payer: Self-pay | Admitting: Medical

## 2023-06-29 VITALS — BP 146/90 | HR 86 | Temp 98.2°F | Wt 133.6 lb

## 2023-06-29 DIAGNOSIS — I10 Essential (primary) hypertension: Secondary | ICD-10-CM | POA: Diagnosis not present

## 2023-06-29 DIAGNOSIS — R42 Dizziness and giddiness: Secondary | ICD-10-CM | POA: Diagnosis not present

## 2023-06-29 MED ORDER — PROPRANOLOL HCL 20 MG PO TABS: 20.0000 mg | ORAL_TABLET | Freq: Two times a day (BID) | ORAL | 0 refills | Status: DC | PRN

## 2023-06-29 NOTE — Patient Instructions (Signed)
 Recommendations: You may use trial of Propanolol 1/2-1 tablet once or twice daily as needed over the next week for lightheaded, chest tightness, palpitations, headache or not feeling well Avoid alcohol at least 2 weeks Limit caffeine Cut back or limit salt and salty foods, high sodium foods, processed foods, junk foods Increase water intake Rest and do some relaxation techniques If you have ongoing symptoms over the next 2 weeks we can consider sleep study/sleep evaluation Call or my chart reply in the next week to give me an update Monitor your pulse and pressure some the next week Goal is <130/80 BP.   Pulse normal is 70-90.   Fast pulse would be over 100 at rest

## 2023-06-29 NOTE — Progress Notes (Signed)
 Subjective:  Melanie Love is a 38 y.o. female who presents for Chief Complaint  Patient presents with   Acute Visit    Dizziness, fatigue, bp elevated (140-145/77-97). 1 week ago.      Here today for concerns.  She has a history of hypertension for years.  Diagnosed in young age in her 55s.  Has been in normal state of health and even had a physical a few weeks ago here uneventful.  However the last week has been having sudden spikes in blood pressure, fatigue, headaches, lightheadedness.  Feeling fatigue, headaches intermittent through the day, more occipital in location, headaches can last 30-45 minutes.   Feeling dizzy/lightheaded.   No chest pain, some but sometimes tingling in left arm.   Tightness in chest. Having some bad anxiety of late.  HTN - compliant with nifedipine  for a few years, but has been on other BP medicaiton in the past.    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  She wonders if it is hormonal related as she is about 15 months postpartum.  She has had a menstrual period the last few months.  Last menstrual period 06/06/2023.  Not on birth control but has done some pregnancy test this week which were negative. Not pregnant or breast-feeding currently  No history of sleep apnea.  No known snoring but does not feel rested when awakening.  The only thing different in her routine was that she has had a small container of wine daily for the last months and then added that counter with a daily lab sample.  Otherwise she would typically drink 1 glass of wine per week  Caffeine is 1 cup of caffeine in the morning  She does has history of palpitations, PVCs, has seen cardiology prior for evaluation  Nonsmoker, denies drug use  No other aggravating or relieving factors.    No other c/o.  Past Medical History:  Diagnosis Date    Complication of anesthesia    Dizziness    Dizziness    Extreme prematurity 03/08/2020   Extreme prematurity <30 weeks    H/O hematuria -Proteinuria    History of hyperlipidemia 11/07/2018   History of postpartum gestational hypertension    Hypertension    NSVD (normal spontaneous vaginal delivery) 12/19/2019   PONV (postoperative nausea and vomiting)    Current Outpatient Medications on File Prior to Visit  Medication Sig Dispense Refill   Multiple Vitamin (MULTIVITAMIN) tablet Take 1 tablet by mouth daily.     NIFEdipine  (PROCARDIA -XL/NIFEDICAL-XL) 30 MG 24 hr tablet Take 1 tablet (30 mg total) by mouth 3 (three) times daily. 270 tablet 3   Omega-3 Fatty Acids (FISH OIL PO) Take 1 capsule by mouth daily.     Probiotic Product (PROBIOTIC DAILY PO) Take 1 capsule by mouth daily.     No current facility-administered medications on file prior to visit.     The following portions of the patient's history were reviewed and updated as appropriate: allergies, current medications, past family history, past medical history, past social history, past surgical history and problem list.  ROS Otherwise as in subjective above    Objective: BP (!) 146/90   Pulse 86   Temp 98.2 F (36.8 C) (Oral)   Wt 133 lb 9.6 oz (60.6 kg)   LMP 06/06/2023   SpO2 98%  Breastfeeding No   BMI 22.93 kg/m   Wt Readings from Last 3 Encounters:  06/29/23 133 lb 9.6 oz (60.6 kg)  06/13/23 129 lb 9.6 oz (58.8 kg)  10/23/22 129 lb 6.4 oz (58.7 kg)   BP Readings from Last 3 Encounters:  06/29/23 (!) 146/90  06/13/23 120/82  10/23/22 122/60   General appearance: alert, no distress, well developed, well nourished Neck: supple, no lymphadenopathy, no thyromegaly, no masses Heart: RRR, normal S1, S2, no murmurs Lungs: CTA bilaterally, no wheezes, rhonchi, or rales Pulses: 2+ radial pulses, 2+ pedal pulses, normal cap refill Ext: no edema   EKG reviewed, no acute changes    Assessment: Encounter  Diagnoses  Name Primary?   Elevated blood pressure reading in office with diagnosis of hypertension Yes   Lightheaded      Plan: We discussed symptoms and concerns.  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   EKG reassuring today.   Advised rest, avoid alcohol for 2+ weeks, limit caffeine, limit or avoid junk food, sweets and salty foods for now.   Continue good hydration.  Can monitor blood pressure and pulse some over the weekend particular feeling symptomatic.  Can begin trial of propranolol  as needed over the next few days..  I reviewed her recent well visit labs done a few weeks ago that were okay.  Labs as below.  Call report next week to give us  an update  If symptoms persist over the next several weeks, consider sleep study/sleep evaluation through [facility] or neurology  Patient Instructions  Recommendations: You may use trial of Propanolol 1/2-1 tablet once or twice daily as needed over the next week for lightheaded, chest tightness, palpitations, headache or not feeling well Avoid alcohol at least 2 weeks Limit caffeine Cut back or limit salt and salty foods, high sodium foods, processed foods, junk foods Increase water intake Rest and do some relaxation techniques If you have ongoing symptoms over the next 2 weeks we can consider sleep study/sleep evaluation Call or my chart reply in the next week to give me an update Monitor your pulse and pressure some the next week Goal is <130/80 BP.   Pulse normal is 70-90.   Fast pulse would be over 100 at rest      [name] was seen today for acute visit.  Diagnoses and all orders for this visit:  Elevated blood pressure reading in office with diagnosis of hypertension -     TSH + free T4 -     EKG 12-Lead  Lightheaded -     TSH + free T4 -     EKG 12-Lead  Other orders -     propranolol  (INDERAL ) 20 MG tablet;  Take 1 tablet (20 mg total) by mouth 2 (two) times daily as needed.    Follow up: pending labs

## 2023-06-30 LAB — TSH+FREE T4
Free T4: 1.23 ng/dL (ref 0.82–1.77)
TSH: 1.54 u[IU]/mL (ref 0.450–4.500)

## 2023-06-30 NOTE — Progress Notes (Signed)
 Results sent through MyChart

## 2023-07-06 ENCOUNTER — Other Ambulatory Visit: Payer: Self-pay | Admitting: Medical

## 2024-06-11 ENCOUNTER — Other Ambulatory Visit: Payer: Self-pay | Admitting: Nurse Practitioner

## 2024-06-11 DIAGNOSIS — R002 Palpitations: Secondary | ICD-10-CM

## 2024-06-11 DIAGNOSIS — I10 Essential (primary) hypertension: Secondary | ICD-10-CM

## 2024-06-13 ENCOUNTER — Encounter: Payer: PRIVATE HEALTH INSURANCE | Admitting: Nurse Practitioner

## 2024-07-05 ENCOUNTER — Other Ambulatory Visit: Payer: Self-pay | Admitting: Nurse Practitioner

## 2024-07-05 DIAGNOSIS — R002 Palpitations: Secondary | ICD-10-CM

## 2024-07-05 DIAGNOSIS — I10 Essential (primary) hypertension: Secondary | ICD-10-CM

## 2024-08-22 ENCOUNTER — Ambulatory Visit: Admitting: Nurse Practitioner

## 2024-12-12 ENCOUNTER — Encounter: Admitting: Nurse Practitioner
